# Patient Record
Sex: Male | Born: 1955 | Race: White | Hispanic: No | Marital: Single | Smoking: Current every day smoker
Health system: Southern US, Community
[De-identification: ages and names within clinical notes are randomized; demographics above are authoritative.]

## PROBLEM LIST (undated history)

## (undated) DIAGNOSIS — I82409 Acute embolism and thrombosis of unspecified deep veins of unspecified lower extremity: Secondary | ICD-10-CM

## (undated) DIAGNOSIS — J449 Chronic obstructive pulmonary disease, unspecified: Secondary | ICD-10-CM

## (undated) DIAGNOSIS — I2510 Atherosclerotic heart disease of native coronary artery without angina pectoris: Secondary | ICD-10-CM

## (undated) DIAGNOSIS — I10 Essential (primary) hypertension: Secondary | ICD-10-CM

## (undated) HISTORY — PX: IVC FILTER INSERTION: CATH118245

---

## 2004-03-08 ENCOUNTER — Other Ambulatory Visit: Payer: Self-pay

## 2004-03-08 ENCOUNTER — Emergency Department: Payer: Self-pay | Admitting: Unknown Physician Specialty

## 2004-05-17 ENCOUNTER — Inpatient Hospital Stay: Payer: Self-pay

## 2004-05-27 ENCOUNTER — Other Ambulatory Visit: Payer: Self-pay

## 2004-05-27 ENCOUNTER — Emergency Department: Payer: Self-pay | Admitting: Emergency Medicine

## 2004-06-27 ENCOUNTER — Ambulatory Visit: Payer: Self-pay

## 2005-10-13 ENCOUNTER — Inpatient Hospital Stay: Payer: Self-pay | Admitting: Internal Medicine

## 2006-08-21 ENCOUNTER — Other Ambulatory Visit: Payer: Self-pay

## 2006-08-22 ENCOUNTER — Inpatient Hospital Stay: Payer: Self-pay | Admitting: Internal Medicine

## 2008-03-27 ENCOUNTER — Emergency Department: Payer: Self-pay | Admitting: Emergency Medicine

## 2008-05-06 ENCOUNTER — Emergency Department: Payer: Self-pay | Admitting: Emergency Medicine

## 2008-05-08 ENCOUNTER — Emergency Department: Payer: Self-pay | Admitting: Emergency Medicine

## 2008-05-16 ENCOUNTER — Emergency Department: Payer: Self-pay | Admitting: Emergency Medicine

## 2008-08-03 ENCOUNTER — Emergency Department: Payer: Self-pay | Admitting: Internal Medicine

## 2010-01-10 IMAGING — CT CT ABD-PELV W/ CM
1 of 4 series · 13 of 32 positions shown, 19 images · non-contrast
Comparison: none

REASON FOR EXAM: (1) abd pain; (2) pel pain
COMMENTS:

[Series 2: abdomen · axial · 0.67mm/px · z∈[-990,-590]mm · 13 of 94 slices shown, 19 images]
[im 7/94  soft-tissue]
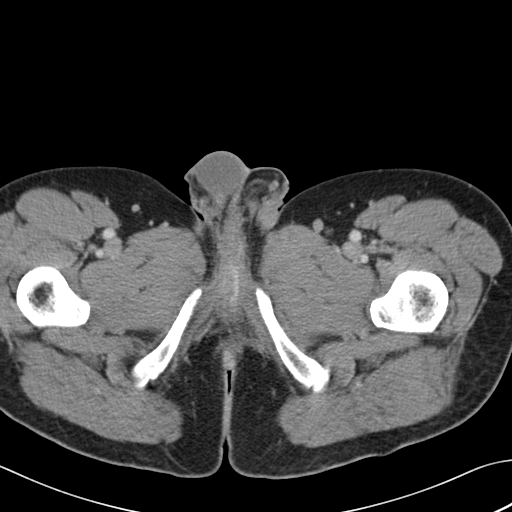
[im 7/94  bone]
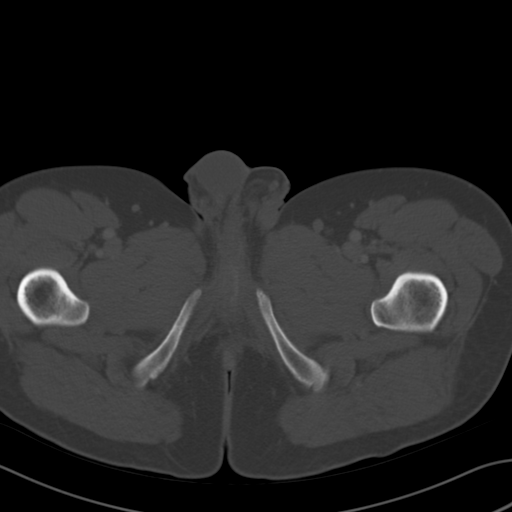
[im 14/94  soft-tissue]
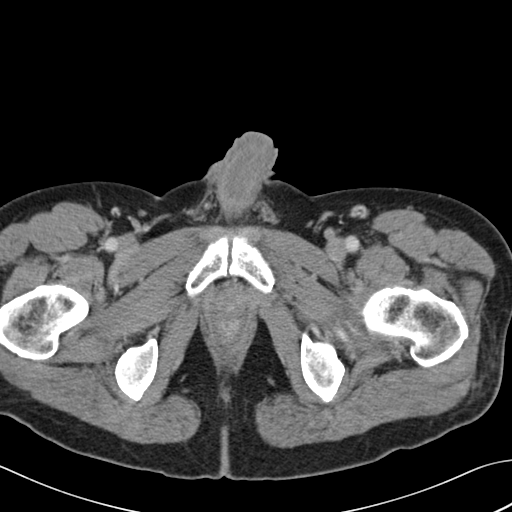
[im 20/94  soft-tissue]
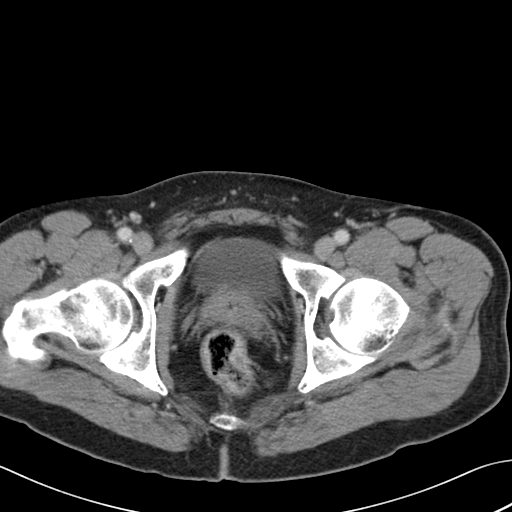
[im 27/94  soft-tissue]
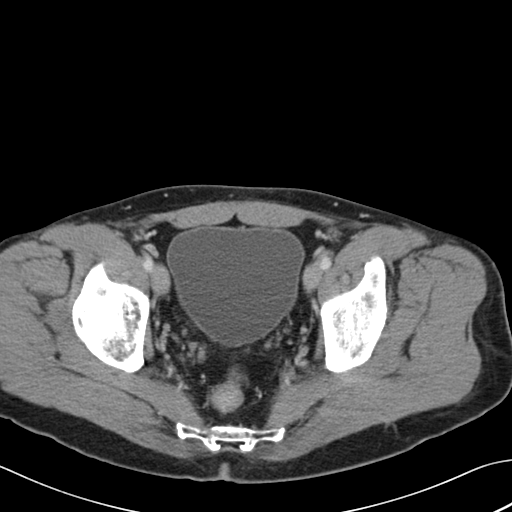
[im 34/94  soft-tissue]
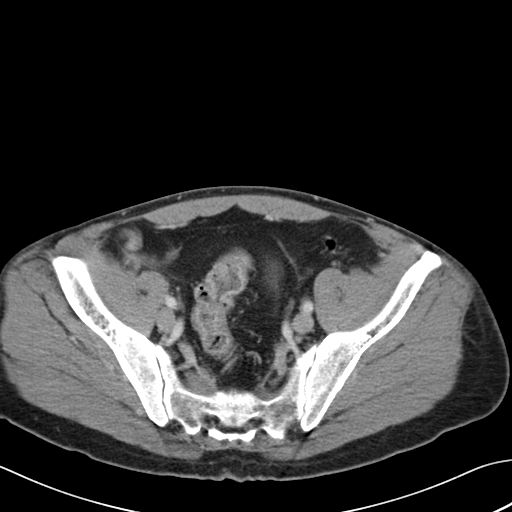
[im 40/94  soft-tissue]
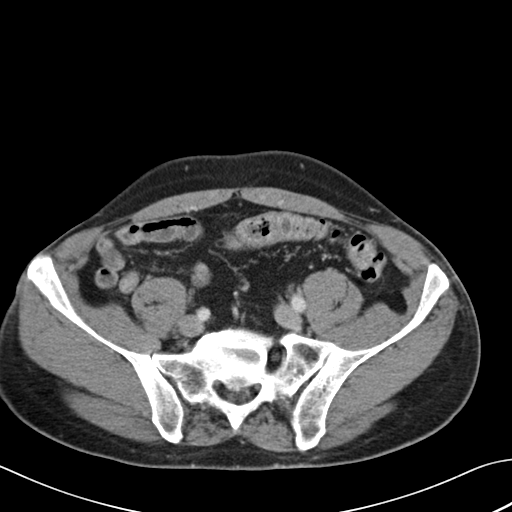
[im 47/94  soft-tissue]
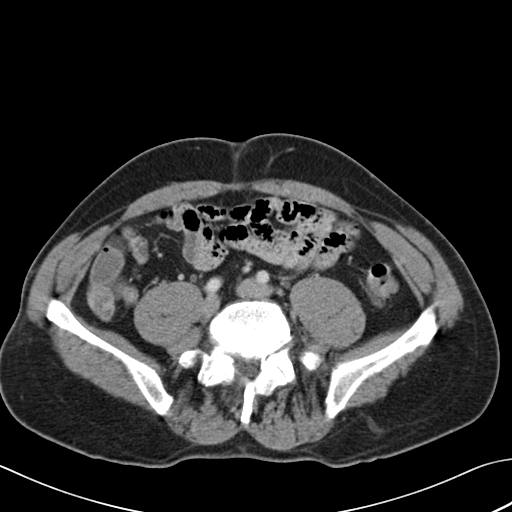
[im 54/94  soft-tissue]
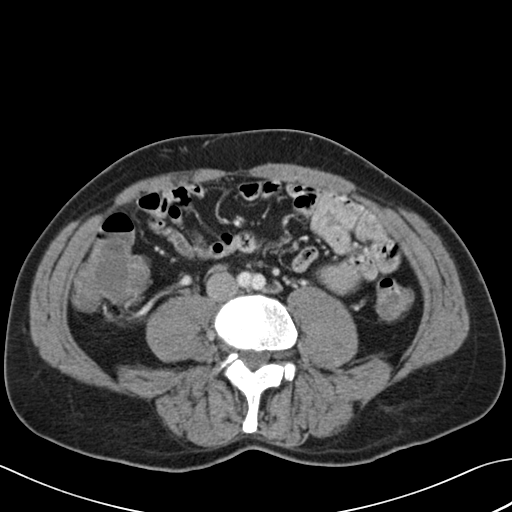
[im 60/94  soft-tissue]
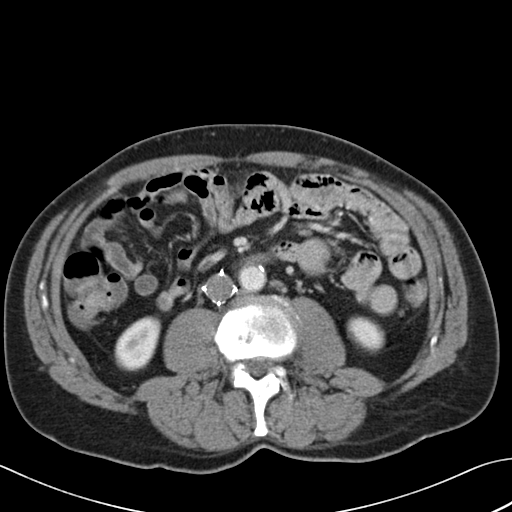
[im 60/94  bone]
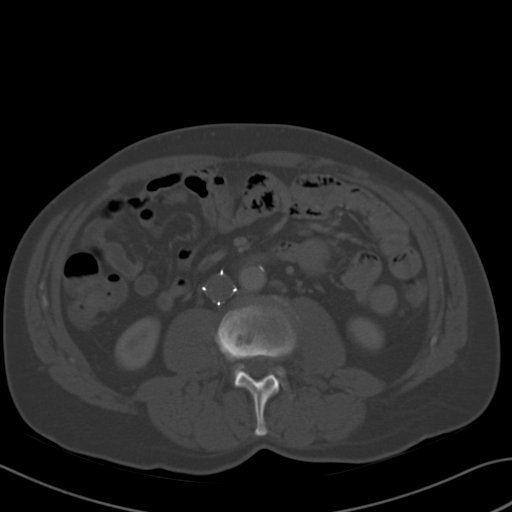
[im 67/94  soft-tissue]
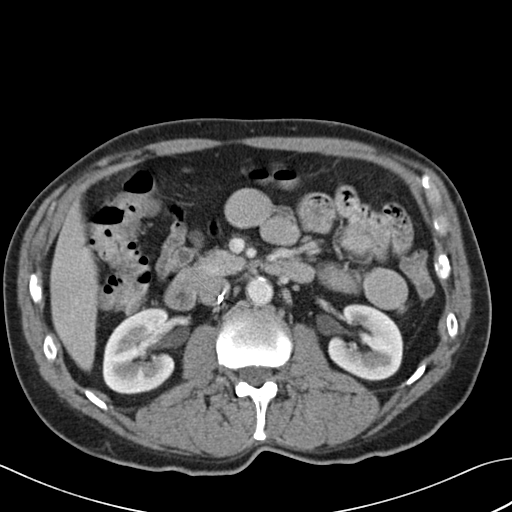
[im 67/94  lung]
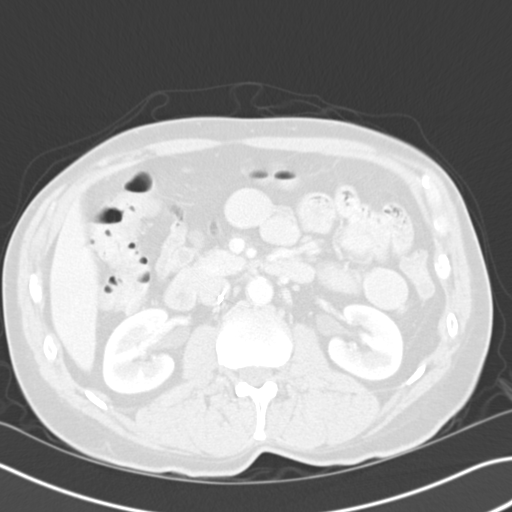
[im 74/94  soft-tissue]
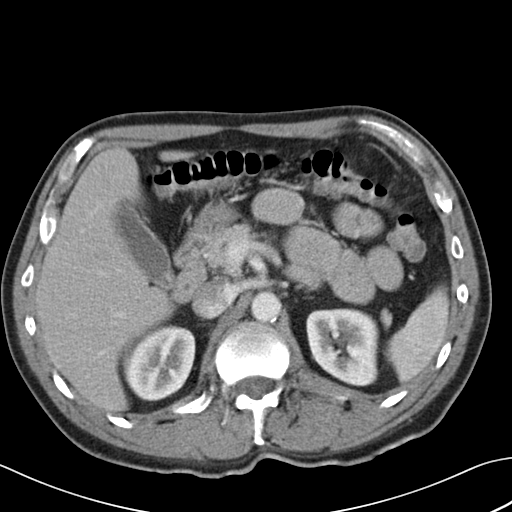
[im 74/94  lung]
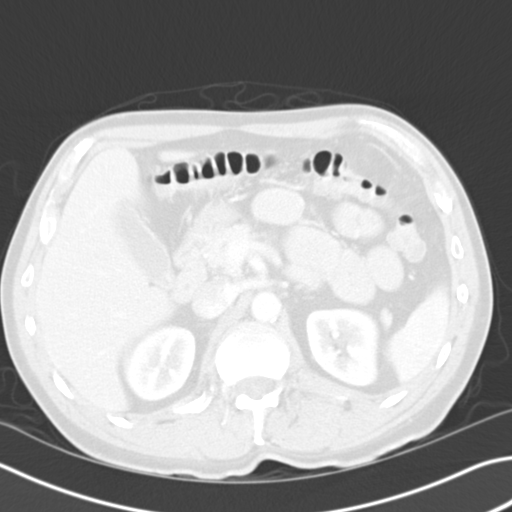
[im 80/94  soft-tissue]
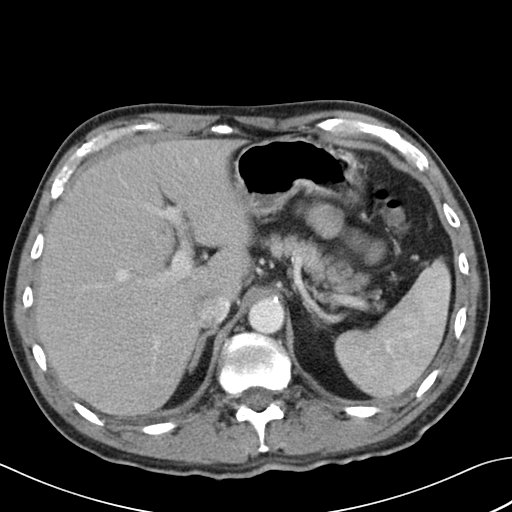
[im 80/94  lung]
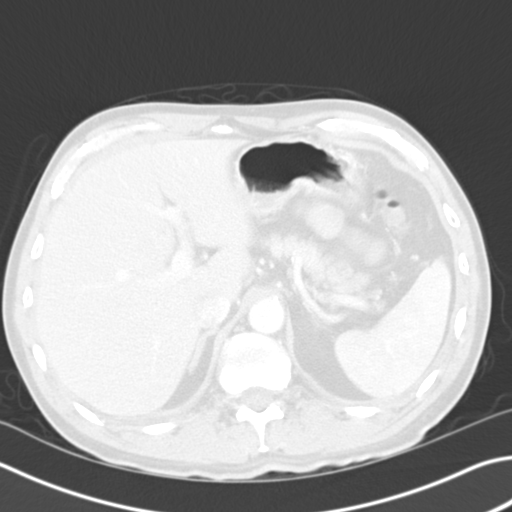
[im 87/94  soft-tissue]
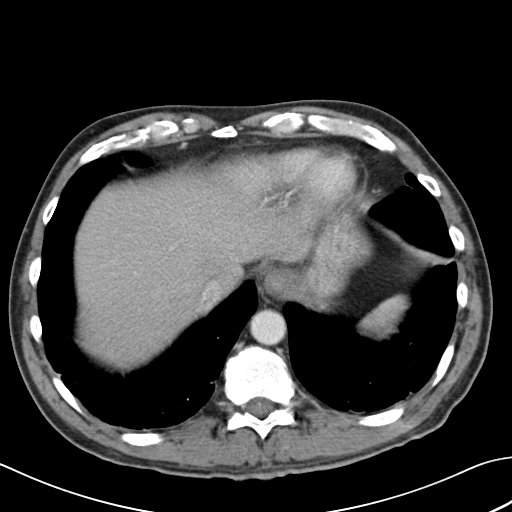
[im 87/94  lung]
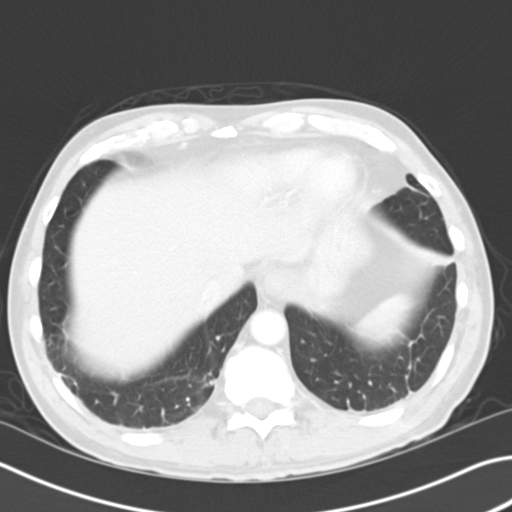

[13 of 32 positions shown; findings below may reference images not displayed]

PROCEDURE:     CT  - CT ABDOMEN / PELVIS  W  - August 03, 2008  [DATE]

RESULT:     History: Pain.

Comparison studies: No recent.

Procedure and Findings: Following demonstration 100 cc of [id number] CT of
the abdomen pelvis obtained. Simple cysts in the liver. These are present on
prior chest CT of 08/21/2006. Spleen normal. Pancreas normal. Adrenals normal.
Kidneys normal. Inferior vena caval filter noted. Caval filter struts are
projected outside of the cava. The filter is otherwise in good anatomic
position. Appendix normal. Pelvis unremarkable. No inguinal adenopathy
noted. Lung bases clear. No free air. Aorta is atherosclerotic.
IMPRESSION: 1.Inferior vena caval filter noted with struts projected
through the caval wall.
2. Simple hepatic cysts. Report phoned to the emergency room physician at
time of study.
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
## 2016-11-05 ENCOUNTER — Emergency Department: Payer: Medicaid - Out of State

## 2016-11-05 ENCOUNTER — Emergency Department
Admission: EM | Admit: 2016-11-05 | Discharge: 2016-11-05 | Disposition: A | Discharge status: Home / Self Care | Payer: Medicaid - Out of State | Attending: Emergency Medicine | Admitting: Emergency Medicine

## 2016-11-05 ENCOUNTER — Encounter: Payer: Self-pay | Admitting: Emergency Medicine

## 2016-11-05 DIAGNOSIS — J449 Chronic obstructive pulmonary disease, unspecified: Secondary | ICD-10-CM | POA: Diagnosis not present

## 2016-11-05 DIAGNOSIS — R1031 Right lower quadrant pain: Secondary | ICD-10-CM | POA: Diagnosis present

## 2016-11-05 DIAGNOSIS — I2510 Atherosclerotic heart disease of native coronary artery without angina pectoris: Secondary | ICD-10-CM | POA: Diagnosis not present

## 2016-11-05 DIAGNOSIS — I10 Essential (primary) hypertension: Secondary | ICD-10-CM | POA: Insufficient documentation

## 2016-11-05 DIAGNOSIS — F17200 Nicotine dependence, unspecified, uncomplicated: Secondary | ICD-10-CM | POA: Insufficient documentation

## 2016-11-05 DIAGNOSIS — R1909 Other intra-abdominal and pelvic swelling, mass and lump: Secondary | ICD-10-CM

## 2016-11-05 DIAGNOSIS — N50811 Right testicular pain: Secondary | ICD-10-CM

## 2016-11-05 HISTORY — DX: Chronic obstructive pulmonary disease, unspecified: J44.9

## 2016-11-05 HISTORY — DX: Essential (primary) hypertension: I10

## 2016-11-05 HISTORY — DX: Atherosclerotic heart disease of native coronary artery without angina pectoris: I25.10

## 2016-11-05 LAB — URINALYSIS, COMPLETE (UACMP) WITH MICROSCOPIC
BACTERIA UA: NONE SEEN
BILIRUBIN URINE: NEGATIVE
GLUCOSE, UA: NEGATIVE mg/dL
HGB URINE DIPSTICK: NEGATIVE
KETONES UR: NEGATIVE mg/dL
Leukocytes, UA: NEGATIVE
Nitrite: NEGATIVE
PROTEIN: NEGATIVE mg/dL
RBC / HPF: NONE SEEN RBC/hpf (ref 0–5)
Specific Gravity, Urine: 1.003 — ABNORMAL LOW (ref 1.005–1.030)
WBC UA: NONE SEEN WBC/hpf (ref 0–5)
pH: 5 (ref 5.0–8.0)

## 2016-11-05 MED ORDER — HYDROCODONE-ACETAMINOPHEN 10-325 MG PO TABS: 1.0000 | ORAL_TABLET | Freq: Four times a day (QID) | ORAL | 0 refills | Status: DC | PRN

## 2016-11-05 MED ORDER — HYDROCODONE-ACETAMINOPHEN 5-325 MG PO TABS
2.0000 | ORAL_TABLET | Freq: Once | ORAL | Status: AC
  Administered 2016-11-05: 2 via ORAL
  Filled 2016-11-05: qty 2

## 2016-11-05 NOTE — Discharge Instructions (Addendum)
Return to the ER for new or worsening pain, swelling, difficulty with bowel movements or passing gas, vomiting, fever, or any other new or worsening symptoms that concern you.

## 2016-11-05 NOTE — ED Provider Notes (Signed)
-----------------------------------------   6:33 PM on 11/05/2016 -----------------------------------------  The UA is negative, and the scrotal ultrasound shows no acute findings. Based on original discussion with Dr. Scotty CourtStafford, the plan was that if these tests are negative to discharge home with additional pain medication which has been prescribed. Patient feels well and is comfortable with the plan. Return precautions given. We will refer to primary care here as patient just moved back to the region and has no PMD.    Dionne BucySiadecki, Elson Ulbrich, MD 11/05/16 [phone]

## 2016-11-05 NOTE — ED Triage Notes (Signed)
Pt via pov from home. He has right inguinal hernia. Last Tuesday he fell and has had increasing groin/testicle pain since then. Pt hx of htn, copd. States he has not had his bp meds in a week because he had it in his luggage on a flight, and luggage was lost. Pt alert & oriented with NAD noted.

## 2016-11-05 NOTE — ED Notes (Signed)
Report received from Shannon RN.

## 2016-11-05 NOTE — ED Provider Notes (Addendum)
[facility] Emergency Department Provider Note  ____________________________________________  Time seen: Approximately 3:17 PM  I have reviewed the triage vital signs and the nursing notes.   Bradley  Chief Complaint Inguinal Hernia    HPI PRESS Dawson is a 61 y.o. male who complains of chronic mild right inguinal pain related to a small inguinal hernia, but he fell 4 days ago and since then has had worsened right inguinal pain radiating into the testicle. Denies any dysuria frequency urgency or hematuria. No significant trauma. No abdominal pain or vomiting. Last bowel movement was today. He is passing gas as usual. Denies fever chills or sweats. No aggravating or alleviating factors, severe, dull aching.     Past Medical Bradley:  Diagnosis Date  . COPD (chronic obstructive pulmonary disease) (HCC)   . Coronary artery disease   . Hypertension      There are no active problems to display for this patient.    Bradley reviewed. No pertinent surgical Bradley.   Prior to Admission medications   Not on File     Allergies Patient has no known allergies.   Bradley reviewed. No pertinent family Bradley.  Social Bradley Social Bradley  Substance Use Topics  . Smoking status: Current Every Day Smoker    Packs/day: 0.50  . Smokeless tobacco: Never Used  . Alcohol use No    Review of Systems  Constitutional:   No fever or chills.  ENT:   No sore throat. No rhinorrhea. Cardiovascular:   No chest pain or syncope. Respiratory:   No dyspnea or cough. Gastrointestinal:   Negative for abdominal pain, vomiting and diarrhea. positive right groin pain Musculoskeletal:   Negative for focal pain or swelling All other systems reviewed and are negative except as documented above in ROS and HPI.  ____________________________________________   PHYSICAL EXAM:  VITAL SIGNS: ED Triage Vitals  Enc Vitals Group     BP 11/05/16 1321 (!) 183/114   Pulse Rate 11/05/16 1321 (!) 103     Resp 11/05/16 1321 18     Temp 11/05/16 1321 97.7 F (36.5 C)     Temp Source 11/05/16 1321 Oral     SpO2 11/05/16 1321 99 %     Weight 11/05/16 1322 140 lb (63.5 kg)     Height 11/05/16 1322 6' (1.829 m)     Head Circumference --      Peak Flow --      Pain Score 11/05/16 1320 9     Pain Loc --      Pain Edu? --      Excl. in GC? --     Vital signs reviewed, nursing assessments reviewed.   Constitutional:   Alert and oriented. Well appearing and in no distress. Eyes:   No scleral icterus.  EOMI. No nystagmus. No conjunctival pallor. PERRL. ENT   Head:   Normocephalic and atraumatic.   Nose:   No congestion/rhinnorhea.    Mouth/Throat:   MMM, no pharyngeal erythema. No peritonsillar mass.    Neck:   No meningismus. Full ROM. Hematological/Lymphatic/Immunilogical:   No cervical lymphadenopathy. Cardiovascular:   RRR. Symmetric bilateral radial and DP pulses.  No murmurs.  Respiratory:   Normal respiratory effort without tachypnea/retractions. Breath sounds are clear and equal bilaterally. No wheezes/rales/rhonchi. Gastrointestinal:   Soft and nontender. Non distended. There is no CVA tenderness.  No rebound, rigidity, or guarding. Genitourinary:   no scrotal swelling. No penile discharge. Right testicular tenderness. No horizontal lie. No mass  but there is tenderness along the right inguinal canal and fullness on digital exploration of the external ring. Musculoskeletal:   Normal range of motion in all extremities. No joint effusions.  No lower extremity tenderness.  No edema. Neurologic:   Normal speech and language.  Motor grossly intact. No gross focal neurologic deficits are appreciated.  Skin:    Skin is warm, dry and intact. No rash noted.  No petechiae, purpura, or bullae.  ____________________________________________    LABS (pertinent positives/negatives) (all labs ordered are listed, but only abnormal results are  displayed) Labs Reviewed  URINE CULTURE  URINALYSIS, COMPLETE (UACMP) WITH MICROSCOPIC   ____________________________________________   EKG    ____________________________________________    RADIOLOGY  No results found.  ____________________________________________   PROCEDURES Procedures  ____________________________________________   DIFFERENTIAL DIAGNOSIS  epididymitis, orchitis, indirect inguinal hernia, urinary tract infection  CLINICAL IMPRESSION / ASSESSMENT AND PLAN / ED COURSE  Pertinent labs & imaging results that were available during my care of the patient were reviewed by me and considered in my medical decision making (see chart for details).   patient presents with right groin and testicle pain. Low suspicion of kidney stone torsion bowel incarceration or strangulation. No evidence of bowel obstruction appendicitis or perforation. We'll obtain ultrasound of the scrotum to evaluate for testicular pathology versus inguinal hernia. Check urinalysis. Care of patient be signed out to Dr. Marisa SeverinSiadecki.  patient does not have any prior prescriptions located in the controlled substance reporting system, so I have written a very limited prescription of his chronic opioid pain medication until he can recover his lost luggage.  Clinical Course as of Nov 05 1532  Wed Nov 05, 2016  1530 I received signout from Dr. Scotty CourtStafford.  Likely acute on chronic hernia pain.  If negative scrotal US and UA, anticipate d/c home.   [SS]    Clinical Course User Index [SS] Dionne BucySiadecki, Sebastian, MD     ____________________________________________   FINAL CLINICAL IMPRESSION(S) / ED DIAGNOSES    Final diagnoses:  Right groin pain      New Prescriptions   No medications on file     Portions of this note were generated with dragon dictation software. Dictation errors may occur despite best attempts at proofreading.    Sharman CheekStafford, Gwenyth Dingee, MD 11/05/16 1525    Sharman CheekStafford,  Meghana Tullo, MD 11/05/16 [phone]

## 2016-11-08 LAB — URINE CULTURE: CULTURE: NO GROWTH

## 2017-04-20 ENCOUNTER — Encounter: Payer: Self-pay | Admitting: Podiatry

## 2017-04-20 ENCOUNTER — Ambulatory Visit: Payer: Self-pay

## 2017-04-20 DIAGNOSIS — M779 Enthesopathy, unspecified: Principal | ICD-10-CM

## 2017-04-20 DIAGNOSIS — M778 Other enthesopathies, not elsewhere classified: Secondary | ICD-10-CM

## 2017-04-20 NOTE — Progress Notes (Signed)
This encounter was created in error - please disregard.

## 2017-05-14 ENCOUNTER — Inpatient Hospital Stay
Admission: EM | Admit: 2017-05-14 | Discharge: 2017-05-16 | DRG: 272 | Disposition: A | Discharge status: Home / Self Care | Payer: Medicaid Other | Attending: Internal Medicine | Admitting: Internal Medicine

## 2017-05-14 ENCOUNTER — Encounter: Payer: Self-pay | Admitting: Emergency Medicine

## 2017-05-14 ENCOUNTER — Other Ambulatory Visit: Payer: Self-pay

## 2017-05-14 ENCOUNTER — Emergency Department: Payer: Medicaid Other

## 2017-05-14 DIAGNOSIS — Z79899 Other long term (current) drug therapy: Secondary | ICD-10-CM | POA: Diagnosis not present

## 2017-05-14 DIAGNOSIS — E876 Hypokalemia: Secondary | ICD-10-CM | POA: Diagnosis present

## 2017-05-14 DIAGNOSIS — I82220 Acute embolism and thrombosis of inferior vena cava: Secondary | ICD-10-CM | POA: Diagnosis present

## 2017-05-14 DIAGNOSIS — M7989 Other specified soft tissue disorders: Secondary | ICD-10-CM | POA: Diagnosis present

## 2017-05-14 DIAGNOSIS — Z7901 Long term (current) use of anticoagulants: Secondary | ICD-10-CM

## 2017-05-14 DIAGNOSIS — Z7982 Long term (current) use of aspirin: Secondary | ICD-10-CM | POA: Diagnosis not present

## 2017-05-14 DIAGNOSIS — Z7951 Long term (current) use of inhaled steroids: Secondary | ICD-10-CM

## 2017-05-14 DIAGNOSIS — I87002 Postthrombotic syndrome without complications of left lower extremity: Secondary | ICD-10-CM | POA: Diagnosis present

## 2017-05-14 DIAGNOSIS — Z86718 Personal history of other venous thrombosis and embolism: Secondary | ICD-10-CM

## 2017-05-14 DIAGNOSIS — F17210 Nicotine dependence, cigarettes, uncomplicated: Secondary | ICD-10-CM | POA: Diagnosis present

## 2017-05-14 DIAGNOSIS — I82411 Acute embolism and thrombosis of right femoral vein: Secondary | ICD-10-CM | POA: Diagnosis present

## 2017-05-14 DIAGNOSIS — I2510 Atherosclerotic heart disease of native coronary artery without angina pectoris: Secondary | ICD-10-CM | POA: Diagnosis present

## 2017-05-14 DIAGNOSIS — I10 Essential (primary) hypertension: Secondary | ICD-10-CM | POA: Diagnosis present

## 2017-05-14 DIAGNOSIS — J449 Chronic obstructive pulmonary disease, unspecified: Secondary | ICD-10-CM | POA: Diagnosis present

## 2017-05-14 DIAGNOSIS — R600 Localized edema: Secondary | ICD-10-CM | POA: Diagnosis not present

## 2017-05-14 DIAGNOSIS — I82421 Acute embolism and thrombosis of right iliac vein: Secondary | ICD-10-CM | POA: Diagnosis present

## 2017-05-14 DIAGNOSIS — I82401 Acute embolism and thrombosis of unspecified deep veins of right lower extremity: Secondary | ICD-10-CM | POA: Diagnosis not present

## 2017-05-14 DIAGNOSIS — M79604 Pain in right leg: Secondary | ICD-10-CM | POA: Diagnosis not present

## 2017-05-14 DIAGNOSIS — O2230 Deep phlebothrombosis in pregnancy, unspecified trimester: Secondary | ICD-10-CM | POA: Diagnosis present

## 2017-05-14 DIAGNOSIS — I82402 Acute embolism and thrombosis of unspecified deep veins of left lower extremity: Secondary | ICD-10-CM | POA: Diagnosis not present

## 2017-05-14 DIAGNOSIS — I824Y1 Acute embolism and thrombosis of unspecified deep veins of right proximal lower extremity: Secondary | ICD-10-CM

## 2017-05-14 HISTORY — DX: Acute embolism and thrombosis of unspecified deep veins of unspecified lower extremity: I82.409

## 2017-05-14 LAB — BASIC METABOLIC PANEL
ANION GAP: 10 (ref 5–15)
BUN: 6 mg/dL (ref 6–20)
CALCIUM: 8.8 mg/dL — AB (ref 8.9–10.3)
CO2: 26 mmol/L (ref 22–32)
Chloride: 102 mmol/L (ref 101–111)
Creatinine, Ser: 0.75 mg/dL (ref 0.61–1.24)
GLUCOSE: 94 mg/dL (ref 65–99)
Potassium: 4.1 mmol/L (ref 3.5–5.1)
SODIUM: 138 mmol/L (ref 135–145)

## 2017-05-14 LAB — PROTIME-INR
INR: 1.01
Prothrombin Time: 13.2 seconds (ref 11.4–15.2)

## 2017-05-14 LAB — CBC WITH DIFFERENTIAL/PLATELET
BASOS ABS: 0.1 10*3/uL (ref 0–0.1)
BASOS PCT: 1 %
EOS ABS: 0.2 10*3/uL (ref 0–0.7)
EOS PCT: 4 %
HCT: 47.5 % (ref 40.0–52.0)
Hemoglobin: 15.6 g/dL (ref 13.0–18.0)
Lymphocytes Relative: 24 %
Lymphs Abs: 1.6 10*3/uL (ref 1.0–3.6)
MCH: 29.3 pg (ref 26.0–34.0)
MCHC: 32.9 g/dL (ref 32.0–36.0)
MCV: 89.2 fL (ref 80.0–100.0)
MONO ABS: 0.8 10*3/uL (ref 0.2–1.0)
Monocytes Relative: 12 %
Neutro Abs: 3.9 10*3/uL (ref 1.4–6.5)
Neutrophils Relative %: 59 %
PLATELETS: 301 10*3/uL (ref 150–440)
RBC: 5.32 MIL/uL (ref 4.40–5.90)
RDW: 15.5 % — AB (ref 11.5–14.5)
WBC: 6.6 10*3/uL (ref 3.8–10.6)

## 2017-05-14 LAB — APTT: APTT: 30 s (ref 24–36)

## 2017-05-14 MED ORDER — AMLODIPINE BESYLATE 5 MG PO TABS
5.0000 mg | ORAL_TABLET | Freq: Every day | ORAL | Status: DC
  Administered 2017-05-14 – 2017-05-16 (×3): 5 mg via ORAL
  Filled 2017-05-14 (×3): qty 1

## 2017-05-14 MED ORDER — MORPHINE SULFATE (PF) 4 MG/ML IV SOLN
4.0000 mg | Freq: Once | INTRAVENOUS | Status: AC
  Administered 2017-05-14: 4 mg via INTRAVENOUS
  Filled 2017-05-14: qty 1

## 2017-05-14 MED ORDER — HEPARIN (PORCINE) IN NACL 100-0.45 UNIT/ML-% IJ SOLN
1450.0000 [IU]/h | INTRAMUSCULAR | Status: DC
Start: 2017-05-14 — End: 2017-05-16
  Administered 2017-05-14: 1000 [IU]/h via INTRAVENOUS
  Filled 2017-05-14 (×2): qty 250

## 2017-05-14 MED ORDER — HYDROCODONE-ACETAMINOPHEN 10-325 MG PO TABS
1.0000 | ORAL_TABLET | Freq: Four times a day (QID) | ORAL | Status: DC | PRN
  Administered 2017-05-14 – 2017-05-16 (×4): 1 via ORAL
  Filled 2017-05-14 (×4): qty 1

## 2017-05-14 MED ORDER — NICOTINE 21 MG/24HR TD PT24
21.0000 mg | MEDICATED_PATCH | Freq: Every day | TRANSDERMAL | Status: DC
  Administered 2017-05-14 – 2017-05-16 (×3): 21 mg via TRANSDERMAL
  Filled 2017-05-14 (×3): qty 1

## 2017-05-14 MED ORDER — AMLODIPINE BESYLATE 5 MG PO TABS: 5.0000 mg | ORAL_TABLET | Freq: Every day | ORAL | Status: DC

## 2017-05-14 MED ORDER — ATORVASTATIN CALCIUM 20 MG PO TABS
40.0000 mg | ORAL_TABLET | Freq: Every day | ORAL | Status: DC
  Administered 2017-05-15: 40 mg via ORAL
  Filled 2017-05-14: qty 2

## 2017-05-14 MED ORDER — LEVOCETIRIZINE DIHYDROCHLORIDE 5 MG PO TABS: 5.0000 mg | ORAL_TABLET | Freq: Every evening | ORAL | Status: DC

## 2017-05-14 MED ORDER — DOCUSATE SODIUM 100 MG PO CAPS
100.0000 mg | ORAL_CAPSULE | Freq: Two times a day (BID) | ORAL | Status: DC
  Administered 2017-05-14 – 2017-05-16 (×3): 100 mg via ORAL
  Filled 2017-05-14 (×3): qty 1

## 2017-05-14 MED ORDER — LORATADINE 10 MG PO TABS
10.0000 mg | ORAL_TABLET | Freq: Every evening | ORAL | Status: DC
  Administered 2017-05-14 – 2017-05-15 (×2): 10 mg via ORAL
  Filled 2017-05-14 (×2): qty 1

## 2017-05-14 MED ORDER — IPRATROPIUM-ALBUTEROL 0.5-2.5 (3) MG/3ML IN SOLN: 3.0000 mL | Freq: Four times a day (QID) | RESPIRATORY_TRACT | Status: DC | PRN

## 2017-05-14 MED ORDER — SODIUM CHLORIDE 0.9 % IV SOLN: 250.0000 mL | INTRAVENOUS | Status: DC | PRN

## 2017-05-14 MED ORDER — SODIUM CHLORIDE 0.9% FLUSH: 3.0000 mL | INTRAVENOUS | Status: DC | PRN

## 2017-05-14 MED ORDER — SERTRALINE HCL 50 MG PO TABS
50.0000 mg | ORAL_TABLET | Freq: Every day | ORAL | Status: DC
  Administered 2017-05-15 – 2017-05-16 (×2): 50 mg via ORAL
  Filled 2017-05-14 (×2): qty 1

## 2017-05-14 MED ORDER — ISOSORBIDE MONONITRATE ER 30 MG PO TB24
60.0000 mg | ORAL_TABLET | Freq: Every day | ORAL | Status: DC
  Administered 2017-05-15 – 2017-05-16 (×2): 60 mg via ORAL
  Filled 2017-05-14 (×2): qty 2

## 2017-05-14 MED ORDER — ACETAMINOPHEN 650 MG RE SUPP: 650.0000 mg | Freq: Four times a day (QID) | RECTAL | Status: DC | PRN

## 2017-05-14 MED ORDER — UMECLIDINIUM-VILANTEROL 62.5-25 MCG/INH IN AEPB
1.0000 | INHALATION_SPRAY | Freq: Every day | RESPIRATORY_TRACT | Status: DC
  Administered 2017-05-16: 1 via RESPIRATORY_TRACT
  Filled 2017-05-14: qty 14

## 2017-05-14 MED ORDER — ONDANSETRON HCL 4 MG/2ML IJ SOLN: 4.0000 mg | Freq: Four times a day (QID) | INTRAMUSCULAR | Status: DC | PRN

## 2017-05-14 MED ORDER — GABAPENTIN 300 MG PO CAPS
300.0000 mg | ORAL_CAPSULE | Freq: Three times a day (TID) | ORAL | Status: DC
  Administered 2017-05-14 – 2017-05-16 (×4): 300 mg via ORAL
  Filled 2017-05-14 (×4): qty 1

## 2017-05-14 MED ORDER — ACETAMINOPHEN 325 MG PO TABS: 650.0000 mg | ORAL_TABLET | Freq: Four times a day (QID) | ORAL | Status: DC | PRN

## 2017-05-14 MED ORDER — RANOLAZINE ER 500 MG PO TB12
500.0000 mg | ORAL_TABLET | Freq: Two times a day (BID) | ORAL | Status: DC
  Administered 2017-05-14 – 2017-05-16 (×3): 500 mg via ORAL
  Filled 2017-05-14 (×5): qty 1

## 2017-05-14 MED ORDER — ASPIRIN EC 81 MG PO TBEC
81.0000 mg | DELAYED_RELEASE_TABLET | Freq: Every day | ORAL | Status: DC
  Administered 2017-05-15 – 2017-05-16 (×2): 81 mg via ORAL
  Filled 2017-05-14 (×2): qty 1

## 2017-05-14 MED ORDER — ONDANSETRON HCL 4 MG PO TABS: 4.0000 mg | ORAL_TABLET | Freq: Four times a day (QID) | ORAL | Status: DC | PRN

## 2017-05-14 MED ORDER — SODIUM CHLORIDE 0.9% FLUSH
3.0000 mL | Freq: Two times a day (BID) | INTRAVENOUS | Status: DC
  Administered 2017-05-15 – 2017-05-16 (×2): 3 mL via INTRAVENOUS

## 2017-05-14 MED ORDER — TRAZODONE HCL 50 MG PO TABS
50.0000 mg | ORAL_TABLET | Freq: Every evening | ORAL | Status: DC | PRN
  Administered 2017-05-14: 50 mg via ORAL
  Filled 2017-05-14: qty 1

## 2017-05-14 MED ORDER — ATENOLOL 25 MG PO TABS
25.0000 mg | ORAL_TABLET | Freq: Every day | ORAL | Status: DC
  Administered 2017-05-16: 25 mg via ORAL
  Filled 2017-05-14 (×2): qty 1

## 2017-05-14 MED ORDER — HEPARIN BOLUS VIA INFUSION
3600.0000 [IU] | Freq: Once | INTRAVENOUS | Status: AC
  Administered 2017-05-14: 3600 [IU] via INTRAVENOUS
  Filled 2017-05-14: qty 3600

## 2017-05-14 MED ORDER — ALBUTEROL SULFATE (2.5 MG/3ML) 0.083% IN NEBU: 2.5000 mg | INHALATION_SOLUTION | Freq: Four times a day (QID) | RESPIRATORY_TRACT | Status: DC | PRN

## 2017-05-14 NOTE — Progress Notes (Signed)
Advanced care plan.  Purpose of the Encounter: CODE STATUS Parties in Attendance: Patient Patient's Decision Capacity: Good Subjective/Patient's story: Presented to the ER with right leg pain and swelling Objective/Medical story Has DVT in the right lower extremity Goals of care determination:  Advanced directives discussed Currently wants everything done which includes cardiac resuscitation, intubation and ventilator if the need arises CODE STATUS: Full code Time spent discussing advanced care planning: 16 minutes

## 2017-05-14 NOTE — ED Triage Notes (Addendum)
Pt comes into the ED via POV c/o right leg swelling that started about 3 days ago.  Patient has swelling noted to the leg and the patient states that at some points it looks blue in color.  Patient is ambulatory at this time.  Patient has his of CAD.  Patient in NAD at this time.  Patient has a h/o blood clots in the past. No pedal pulses palpated at this time and cyanosis present on toes.  Doppler used on patient to find pulses, no pedal pulse found with doppler, only posterior tibial pulse noted at this time.

## 2017-05-14 NOTE — H&P (Signed)
[facility] - [place] at [facility]   PATIENT NAME: Bradley Dawson    MR#:  [id number]  DATE OF BIRTH:  04/28/1955  DATE OF ADMISSION:  05/14/2017  PRIMARY CARE PHYSICIAN: Patient, No Pcp Per   REQUESTING/REFERRING PHYSICIAN:   CHIEF COMPLAINT:   Chief Complaint  Patient presents with  . Leg Swelling    HISTORY OF PRESENT ILLNESS: Bradley Dawson  is a 62 y.o. male with a known history of DVT in the past hypertension, COPD and coronary artery disease presented to the emergency room with swelling of the right and lower extremity.  Patient had lower extremity DVT on the left side in the past and was on Eliquis.  Patient has moved from [place] to [place] in October last year.  Patient has not been taking oral Eliquis for the last couple of months since last October he was evaluated with a Doppler ultrasound in the emergency room which showed extensive right lower extremity DVT.[name] He also has a existing DVT in the left lower extremity.  Patient was started on IV heparin drip for anticoagulation and hospitalist service was consulted.  Pain in the right lower extremity is aching in nature 7 out of 10 on a scale of 1-10.  PAST MEDICAL HISTORY:   Past Medical History:  Diagnosis Date  . COPD (chronic obstructive pulmonary disease) (HCC)   . Coronary artery disease   . DVT (deep venous thrombosis) (HCC)   . Hypertension     PAST SURGICAL HISTORY:  Past Surgical History:  Procedure Laterality Date  . IVC FILTER INSERTION      SOCIAL HISTORY:  Social History   Tobacco Use  . Smoking status: Current Every Day Smoker    Packs/day: 0.50  . Smokeless tobacco: Never Used  Substance Use Topics  . Alcohol use: No    FAMILY HISTORY:  Family History  Problem Relation Age of Onset  . Pulmonary embolism Mother   . Cancer Father     DRUG ALLERGIES: No Known Allergies  REVIEW OF SYSTEMS:   CONSTITUTIONAL: No fever, fatigue or weakness.  EYES: No blurred or  double vision.  EARS, NOSE, AND THROAT: No tinnitus or ear pain.  RESPIRATORY: No cough, shortness of breath, wheezing or hemoptysis.  CARDIOVASCULAR: No chest pain, orthopnea, edema.  GASTROINTESTINAL: No nausea, vomiting, diarrhea or abdominal pain.  GENITOURINARY: No dysuria, hematuria.  ENDOCRINE: No polyuria, nocturia,  HEMATOLOGY: No anemia, easy bruising or bleeding SKIN: No rash or lesion. MUSCULOSKELETAL: No joint pain or arthritis.   Swelling of lower extremities NEUROLOGIC: No tingling, numbness, weakness.  PSYCHIATRY: No anxiety or depression.   MEDICATIONS AT HOME:  Prior to Admission medications   Medication Sig Start Date End Date Taking? Authorizing Provider  albuterol (PROVENTIL HFA;VENTOLIN HFA) 108 (90 Base) MCG/ACT inhaler Inhale 2 puffs into the lungs every 6 (six) hours as needed for wheezing.    [provider]  apixaban (ELIQUIS) 5 MG TABS tablet Take 5 mg by mouth 2 (two) times daily.    [provider]  aspirin EC 81 MG tablet Take 81 mg by mouth daily.    [provider]  atenolol (TENORMIN) 25 MG tablet Take 25 mg by mouth daily.    [provider]  atorvastatin (LIPITOR) 40 MG tablet Take 40 mg by mouth daily with supper.    [provider]  gabapentin (NEURONTIN) 300 MG capsule Take 300 mg by mouth 3 (three) times daily.    [provider]  HYDROcodone-acetaminophen (NORCO) 10-325 MG tablet Take 1 tablet by mouth every 6 (six) hours as needed. 11/05/16   Sharman CheekStafford, Phillip, MD  Ipratropium-Albuterol (COMBIVENT IN) Inhale 2 puffs into the lungs every 6 (six) hours as needed (Wheezing).    [provider]  ipratropium-albuterol (DUONEB) 0.5-2.5 (3) MG/3ML SOLN Take 3 mLs by nebulization every 6 (six) hours as needed.    [provider]  isosorbide mononitrate (IMDUR) 60 MG 24 hr tablet Take 60 mg by mouth daily.    [provider]  levocetirizine (XYZAL) 5 MG tablet Take 5 mg by  mouth every evening.    [provider]  ranolazine (RANEXA) 1000 MG SR tablet Take 500 mg by mouth 2 (two) times daily.    [provider]  sertraline (ZOLOFT) 50 MG tablet Take 50 mg by mouth daily.    [provider]  traZODone (DESYREL) 50 MG tablet Take 50 mg by mouth at bedtime as needed for sleep.    [provider]  umeclidinium-vilanterol (ANORO ELLIPTA) 62.5-25 MCG/INH AEPB Inhale 1 puff into the lungs daily.    [provider]      PHYSICAL EXAMINATION:   VITAL SIGNS: Blood pressure (!) 164/96, pulse 83, temperature 99.4 F (37.4 C), temperature source Oral, resp. rate 16, height 6\' 1"  (1.854 m), weight 61.2 kg (135 lb), SpO2 100 %.  GENERAL:  62 y.o.-year-old patient lying in the bed with no acute distress.  EYES: Pupils equal, round, reactive to light and accommodation. No scleral icterus. Extraocular muscles intact.  HEENT: Head atraumatic, normocephalic. Oropharynx and nasopharynx clear.  NECK:  Supple, no jugular venous distention. No thyroid enlargement, no tenderness.  LUNGS: Normal breath sounds bilaterally, no wheezing, rales,rhonchi or crepitation. No use of accessory muscles of respiration.  CARDIOVASCULAR: S1, S2 normal. No murmurs, rubs, or gallops.  ABDOMEN: Soft, nontender, nondistended. Bowel sounds present. No organomegaly or mass.  EXTREMITIES: No cyanosis, or clubbing.  Swelling in right leg greater than left leg Tenderness in right lower extremity NEUROLOGIC: Cranial nerves II through XII are intact. Muscle strength 5/5 in all extremities. Sensation intact. Gait not checked.  PSYCHIATRIC: The patient is alert and oriented x 3.  SKIN: No obvious rash, lesion, or ulcer.   LABORATORY PANEL:   CBC Recent Labs  Lab 05/14/17 1944  WBC 6.6  HGB 15.6  HCT 47.5  PLT 301  MCV 89.2  MCH 29.3  MCHC 32.9  RDW 15.5*  LYMPHSABS 1.6  MONOABS 0.8  EOSABS 0.2  BASOSABS 0.1    ------------------------------------------------------------------------------------------------------------------  Chemistries  Recent Labs  Lab 05/14/17 1944  NA 138  K 4.1  CL 102  CO2 26  GLUCOSE 94  BUN 6  CREATININE 0.75  CALCIUM 8.8*   ------------------------------------------------------------------------------------------------------------------ estimated creatinine clearance is 83.9 mL/min (by C-G formula based on SCr of 0.75 mg/dL). ------------------------------------------------------------------------------------------------------------------ No results for input(s): TSH, T4TOTAL, T3FREE, THYROIDAB in the last 72 hours.  Invalid input(s): FREET3   Coagulation profile No results for input(s): INR, PROTIME in the last 168 hours. ------------------------------------------------------------------------------------------------------------------- No results for input(s): DDIMER in the last 72 hours. -------------------------------------------------------------------------------------------------------------------  Cardiac Enzymes No results for input(s): CKMB, TROPONINI, MYOGLOBIN in the last 168 hours.  Invalid input(s): CK ------------------------------------------------------------------------------------------------------------------ Invalid input(s): POCBNP  ---------------------------------------------------------------------------------------------------------------  Urinalysis    Component Value Date/Time   COLORURINE COLORLESS (A) 11/05/2016 1458   APPEARANCEUR CLEAR (A) 11/05/2016 1458   LABSPEC 1.003 (L) 11/05/2016 1458   PHURINE 5.0 11/05/2016 1458   GLUCOSEU NEGATIVE 11/05/2016 1458  HGBUR NEGATIVE 11/05/2016 1458   BILIRUBINUR NEGATIVE 11/05/2016 1458   KETONESUR NEGATIVE 11/05/2016 1458   PROTEINUR NEGATIVE 11/05/2016 1458   NITRITE NEGATIVE 11/05/2016 1458   LEUKOCYTESUR NEGATIVE 11/05/2016 1458     RADIOLOGY: US Venous Img Lower  Unilateral Right  Result Date: 05/14/2017 CLINICAL DATA:  62 year old male with right lower extremity swelling for the past 3 days. Prior history left lower extremity DVT EXAM: RIGHT LOWER EXTREMITY VENOUS DOPPLER ULTRASOUND TECHNIQUE: Gray-scale sonography with graded compression, as well as color Doppler and duplex ultrasound were performed to evaluate the lower extremity deep venous systems from the level of the common femoral vein and including the common femoral, femoral, profunda femoral, popliteal and calf veins including the posterior tibial, peroneal and gastrocnemius veins when visible. The superficial great saphenous vein was also interrogated. Spectral Doppler was utilized to evaluate flow at rest and with distal augmentation maneuvers in the common femoral, femoral and popliteal veins. COMPARISON:  None. FINDINGS: Contralateral Common Femoral Vein: Abnormal contralateral left common femoral vein. The vessel is partially compressible. There is internal echogenic material but fairly significant flow on color Doppler imaging. Common Femoral Vein: Abnormal. The vessel is not compressible. There is nonocclusive echogenic material within the common femoral vein consistent with acute thrombus. Saphenofemoral Junction: Extension of thrombus into the saphenofemoral junction. The remainder the great saphenous vein is patent. Profunda Femoral Vein: Extension of occlusive thrombus into the profunda femoral vein. Femoral Vein: Extension of occlusive thrombus into the femoral vein extending throughout the thigh. No evidence of color flow on Doppler imaging. Popliteal Vein: Extension of occlusive thrombus into the popliteal vein. Calf Veins: Occlusive thrombus extends into the calf veins in the proximal calf. Superficial Great Saphenous Vein: No evidence of thrombus. Normal compressibility. Venous Reflux:  None. Other Findings:  None. IMPRESSION: 1. Positive for large volume right lower extremity acute DVT which is  nonocclusive in the common femoral vein but occlusive in the femoral, popliteal and calf veins. 2. Also positive for acute versus chronic nonocclusive thrombus in the contralateral left common femoral vein. Electronically Signed   By: Malachy Moan M.D.   On: 05/14/2017 17:24    EKG: Orders placed or performed in visit on 08/21/06  . EKG 12-Lead    IMPRESSION AND PLAN:  62 year old male patient with history of DVT not compliant on oral Eliquis, COPD, hypertension presented to the emergency room with swelling in the right lower extremity and pain.  Right leg DVT Start patient on anticoagulation with IV heparin drip  Left leg DVT Continue anticoagulation with heparin drip  Emphysema  home dose inhaler treatments  Hypertension Continue oral atenolol  Tobacco cessation counseled with the patient for 6 minutes Nicotine patch offered   All the records are reviewed and case discussed with ED provider. Management plans discussed with the patient, family and they are in agreement.  CODE STATUS: Full code    TOTAL TIME TAKING CARE OF THIS PATIENT: 51 minutes.    Ihor Austin M.D on 05/14/2017 at 8:47 PM  Between 7am to 6pm - Pager - [phone]  After 6pm go to [email or web address] - password [email or web address]  [facility]  Office  [phone]  CC: Primary care physician; Patient, No Pcp Per

## 2017-05-14 NOTE — ED Notes (Signed)
Ellen RN, aware of bed assigned  

## 2017-05-14 NOTE — ED Provider Notes (Signed)
[facility] Emergency Department Provider Note  ____________________________________________   I have reviewed the triage vital signs and the nursing notes.   HISTORY  Chief Complaint Leg Swelling   History limited by: Not Limited   HPI Bradley Dawson is a 62 y.o. male who presents to the emergency department today because of concerns for right leg swelling and pain.  It started 3 days ago.  Located primarily in the calf but radiates up to his knee.  He states he is also noticed some discoloration to his leg with it sometimes turning blue.  Patient does have a history of multiple blood clots in the past and does have IVC filter.  He states that he has not been taking his Eliquis because he is recently moved back to the state has not been able to switch over his insurance.  He denies any chest pain or shortness of breath.  Denies any trauma to the leg.  Denies any recent prolonged travel.    Per medical record review patient has a history of COPD, CAD, DVT.   Past Medical History:  Diagnosis Date  . COPD (chronic obstructive pulmonary disease) (HCC)   . Coronary artery disease   . DVT (deep venous thrombosis) (HCC)   . Hypertension     There are no active problems to display for this patient.   Past Surgical History:  Procedure Laterality Date  . IVC FILTER INSERTION      Prior to Admission medications   Medication Sig Start Date End Date Taking? Authorizing Provider  albuterol (PROVENTIL HFA;VENTOLIN HFA) 108 (90 Base) MCG/ACT inhaler Inhale 2 puffs into the lungs every 6 (six) hours as needed for wheezing.    [provider]  apixaban (ELIQUIS) 5 MG TABS tablet Take 5 mg by mouth 2 (two) times daily.    [provider]  aspirin EC 81 MG tablet Take 81 mg by mouth daily.    [provider]  atenolol (TENORMIN) 25 MG tablet Take 25 mg by mouth daily.    [provider]  atorvastatin (LIPITOR) 40 MG tablet Take 40 mg  by mouth daily with supper.    [provider]  gabapentin (NEURONTIN) 300 MG capsule Take 300 mg by mouth 3 (three) times daily.    [provider]  HYDROcodone-acetaminophen (NORCO) 10-325 MG tablet Take 1 tablet by mouth every 6 (six) hours as needed. 11/05/16   Sharman Cheek, MD  Ipratropium-Albuterol (COMBIVENT IN) Inhale 2 puffs into the lungs every 6 (six) hours as needed (Wheezing).    [provider]  ipratropium-albuterol (DUONEB) 0.5-2.5 (3) MG/3ML SOLN Take 3 mLs by nebulization every 6 (six) hours as needed.    [provider]  isosorbide mononitrate (IMDUR) 60 MG 24 hr tablet Take 60 mg by mouth daily.    [provider]  levocetirizine (XYZAL) 5 MG tablet Take 5 mg by mouth every evening.    [provider]  ranolazine (RANEXA) 1000 MG SR tablet Take 500 mg by mouth 2 (two) times daily.    [provider]  sertraline (ZOLOFT) 50 MG tablet Take 50 mg by mouth daily.    [provider]  traZODone (DESYREL) 50 MG tablet Take 50 mg by mouth at bedtime as needed for sleep.    [provider]  umeclidinium-vilanterol (ANORO ELLIPTA) 62.5-25 MCG/INH AEPB Inhale 1 puff into the lungs daily.    [provider]    Allergies Patient has no known allergies.  No family history on file.  Social History Social History   Tobacco Use  . Smoking status: Current Every Day Smoker    Packs/day: 0.50  . Smokeless tobacco: Never Used  Substance Use Topics  . Alcohol use: No  . Drug use: No    Review of Systems Constitutional: No fever/chills Eyes: No visual changes. ENT: No sore throat. Cardiovascular: Denies chest pain. Respiratory: Denies shortness of breath. Gastrointestinal: No abdominal pain.  No nausea, no vomiting.  No diarrhea.   Genitourinary: Negative for dysuria. Musculoskeletal: Positive for right leg swelling and pain. Skin: Negative for rash. Neurological: Negative for  headaches, focal weakness or numbness.  ____________________________________________   PHYSICAL EXAM:  VITAL SIGNS: ED Triage Vitals  Enc Vitals Group     BP 05/14/17 1614 (!) 131/108     Pulse Rate 05/14/17 1614 (!) 116     Resp 05/14/17 1614 18     Temp 05/14/17 1614 99.5 F (37.5 C)     Temp Source 05/14/17 1614 Oral     SpO2 05/14/17 1614 98 %     Weight 05/14/17 1614 135 lb (61.2 kg)     Height 05/14/17 1614 6\' 1"  (1.854 m)     Head Circumference --      Peak Flow --      Pain Score 05/14/17 1619 9   Constitutional: Alert and oriented. Well appearing and in no distress. Eyes: Conjunctivae are normal.  ENT   Head: Normocephalic and atraumatic.   Nose: No congestion/rhinnorhea.   Mouth/Throat: Mucous membranes are moist.   Neck: No stridor. Hematological/Lymphatic/Immunilogical: No cervical lymphadenopathy. Cardiovascular: Normal rate, regular rhythm.  No murmurs, rubs, or gallops.  Respiratory: Normal respiratory effort without tachypnea nor retractions. Breath sounds are clear and equal bilaterally. No wheezes/rales/rhonchi. Gastrointestinal: Soft and non tender. No rebound. No guarding.  Genitourinary: Deferred Musculoskeletal: Positive for right leg swelling. Dorsalis pulse not palpable however was dopplerable. PT dopplerable.  Neurologic:  Normal speech and language. No gross focal neurologic deficits are appreciated.  Skin:  Skin is warm, dry and intact. No rash noted. Psychiatric: Mood and affect are normal. Speech and behavior are normal. Patient exhibits appropriate insight and judgment.  ____________________________________________    LABS (pertinent positives/negatives)  BMP wnl except ca 8.8 CBC wbc 6.6, hgb 15.6, plt 301  ____________________________________________   EKG  None  ____________________________________________    RADIOLOGY  Right LE US Large  DVT  ____________________________________________   PROCEDURES  Procedures  ____________________________________________   INITIAL IMPRESSION / ASSESSMENT AND PLAN / ED COURSE  Pertinent labs & imaging results that were available during my care of the patient were reviewed by me and considered in my medical decision making (see chart for details).  Patient presented to the emergency department today because of concerns for right leg swelling and pain.  Ultrasound does show a large DVT patient has a history of blood clots.  He has been off of his Eliquis.  Given the large nature of the DVT and that he has noticed some skin color change will plan on admission.  Will start heparin.  Discussed findings and plan with patient.   ____________________________________________   FINAL CLINICAL IMPRESSION(S) / ED DIAGNOSES  Final diagnoses:  Acute deep vein thrombosis (DVT) of proximal vein of right lower extremity (HCC)     Note: This dictation was prepared with Dragon dictation. Any transcriptional errors that result from this process are unintentional     Phineas SemenGoodman, Aalaiyah Yassin, MD 05/15/17 1646

## 2017-05-14 NOTE — Progress Notes (Addendum)
ANTICOAGULATION CONSULT NOTE - Initial Consult  Pharmacy Consult for heparin gtt Indication: DVT  No Known Allergies  Patient Measurements: Height: 6\' 1"  (185.4 cm) Weight: 135 lb (61.2 kg) IBW/kg (Calculated) : 79.9 Heparin Dosing Weight: 61.2kg  Vital Signs: Temp: 99.4 F (37.4 C) (04/25 1945) Temp Source: Oral (04/25 1945) BP: 176/95 (04/25 1945) Pulse Rate: 86 (04/25 1945)  Labs: Recent Labs    05/14/17 1944  HGB 15.6  HCT 47.5  PLT 301    CrCl cannot be calculated (No order found.).   Medical History: Past Medical History:  Diagnosis Date  . COPD (chronic obstructive pulmonary disease) (HCC)   . Coronary artery disease   . DVT (deep venous thrombosis) (HCC)   . Hypertension     Medications:   (Not in a hospital admission) Scheduled:  . heparin  3,600 Units Intravenous Once   Infusions:  . 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   Goal of Therapy:  Heparin level 0.3-0.7 units/ml Monitor platelets by anticoagulation protocol: Yes   Plan:  Give 3600 units bolus x 1 Start heparin infusion at 1000 units/hr Check anti-Xa level in 6 hours and daily while on heparin Continue to monitor H&H and platelets  Gerre PebblesGarrett Mercedes Valeriano 05/14/2017,8:07 PM

## 2017-05-15 ENCOUNTER — Encounter: Payer: Self-pay | Admitting: Emergency Medicine

## 2017-05-15 ENCOUNTER — Encounter
Admission: EM | Disposition: A | Discharge status: Home / Self Care | Payer: Self-pay | Source: Home / Self Care | Attending: Internal Medicine

## 2017-05-15 DIAGNOSIS — R600 Localized edema: Secondary | ICD-10-CM

## 2017-05-15 DIAGNOSIS — M79604 Pain in right leg: Secondary | ICD-10-CM

## 2017-05-15 DIAGNOSIS — I82402 Acute embolism and thrombosis of unspecified deep veins of left lower extremity: Secondary | ICD-10-CM

## 2017-05-15 DIAGNOSIS — I82401 Acute embolism and thrombosis of unspecified deep veins of right lower extremity: Secondary | ICD-10-CM

## 2017-05-15 DIAGNOSIS — I2510 Atherosclerotic heart disease of native coronary artery without angina pectoris: Secondary | ICD-10-CM

## 2017-05-15 HISTORY — PX: PERIPHERAL VASCULAR THROMBECTOMY: CATH118306

## 2017-05-15 LAB — SURGICAL PCR SCREEN
MRSA, PCR: NEGATIVE
Staphylococcus aureus: NEGATIVE

## 2017-05-15 LAB — BASIC METABOLIC PANEL
Anion gap: 6 (ref 5–15)
BUN: 7 mg/dL (ref 6–20)
CALCIUM: 8.6 mg/dL — AB (ref 8.9–10.3)
CHLORIDE: 103 mmol/L (ref 101–111)
CO2: 28 mmol/L (ref 22–32)
CREATININE: 0.85 mg/dL (ref 0.61–1.24)
GFR calc Af Amer: >60 mL/min (ref >60)
GFR calc non Af Amer: >60 mL/min (ref >60)
Glucose, Bld: 94 mg/dL (ref 65–99)
Potassium: 3.4 mmol/L — ABNORMAL LOW (ref 3.5–5.1)
Sodium: 137 mmol/L (ref 135–145)

## 2017-05-15 LAB — CBC
HCT: 43.2 % (ref 40.0–52.0)
HEMOGLOBIN: 14.4 g/dL (ref 13.0–18.0)
MCH: 29.7 pg (ref 26.0–34.0)
MCHC: 33.3 g/dL (ref 32.0–36.0)
MCV: 89.2 fL (ref 80.0–100.0)
PLATELETS: 304 10*3/uL (ref 150–440)
RBC: 4.84 MIL/uL (ref 4.40–5.90)
RDW: 15.1 % — AB (ref 11.5–14.5)
WBC: 5 10*3/uL (ref 3.8–10.6)

## 2017-05-15 LAB — HEPARIN LEVEL (UNFRACTIONATED)
HEPARIN UNFRACTIONATED: 0.23 [IU]/mL — AB (ref 0.30–0.70)
Heparin Unfractionated: 0.27 IU/mL — ABNORMAL LOW (ref 0.30–0.70)
Heparin Unfractionated: 0.16 IU/mL — ABNORMAL LOW (ref 0.30–0.70)
Heparin Unfractionated: 0.18 IU/mL — ABNORMAL LOW (ref 0.30–0.70)

## 2017-05-15 LAB — ANTITHROMBIN III: ANTITHROMB III FUNC: 79 % (ref 75–120)

## 2017-05-15 SURGERY — PERIPHERAL VASCULAR THROMBECTOMY
Anesthesia: Moderate Sedation | Laterality: Right

## 2017-05-15 MED ORDER — ALTEPLASE 2 MG IJ SOLR
INTRAMUSCULAR | Status: AC
  Filled 2017-05-15: qty 6

## 2017-05-15 MED ORDER — LIDOCAINE HCL (PF) 1 % IJ SOLN
INTRAMUSCULAR | Status: DC | PRN
  Administered 2017-05-15: 10 mL

## 2017-05-15 MED ORDER — IOPAMIDOL (ISOVUE-300) INJECTION 61%
INTRAVENOUS | Status: DC | PRN
  Administered 2017-05-15: 40 mL via INTRAVENOUS

## 2017-05-15 MED ORDER — FENTANYL CITRATE (PF) 100 MCG/2ML IJ SOLN
INTRAMUSCULAR | Status: AC
  Filled 2017-05-15: qty 2

## 2017-05-15 MED ORDER — MIDAZOLAM HCL 5 MG/5ML IJ SOLN
INTRAMUSCULAR | Status: AC
  Filled 2017-05-15: qty 5

## 2017-05-15 MED ORDER — SODIUM CHLORIDE 0.9 % IV SOLN: INTRAVENOUS | Status: DC

## 2017-05-15 MED ORDER — MUPIROCIN 2 % EX OINT
1.0000 "application " | TOPICAL_OINTMENT | Freq: Two times a day (BID) | CUTANEOUS | Status: DC
  Filled 2017-05-15: qty 22

## 2017-05-15 MED ORDER — HEPARIN BOLUS VIA INFUSION
2000.0000 [IU] | Freq: Once | INTRAVENOUS | Status: AC
  Administered 2017-05-15: 2000 [IU] via INTRAVENOUS
  Filled 2017-05-15: qty 2000

## 2017-05-15 MED ORDER — CEFAZOLIN SODIUM-DEXTROSE 2-4 GM/100ML-% IV SOLN
2.0000 g | INTRAVENOUS | Status: AC
  Administered 2017-05-15: 2 g via INTRAVENOUS
  Filled 2017-05-15: qty 100

## 2017-05-15 MED ORDER — MIDAZOLAM HCL 2 MG/2ML IJ SOLN
INTRAMUSCULAR | Status: DC | PRN
  Administered 2017-05-15 (×4): 1 mg via INTRAVENOUS
  Administered 2017-05-15: 2 mg via INTRAVENOUS
  Administered 2017-05-15: 1 mg via INTRAVENOUS

## 2017-05-15 MED ORDER — ALTEPLASE 2 MG IJ SOLR
INTRAMUSCULAR | Status: DC | PRN
  Administered 2017-05-15: 10 mg

## 2017-05-15 MED ORDER — HEPARIN SODIUM (PORCINE) 1000 UNIT/ML IJ SOLN
INTRAMUSCULAR | Status: DC | PRN
  Administered 2017-05-15: 4000 [IU] via INTRAVENOUS

## 2017-05-15 MED ORDER — LIDOCAINE-EPINEPHRINE (PF) 1 %-1:200000 IJ SOLN
INTRAMUSCULAR | Status: AC
  Filled 2017-05-15: qty 30

## 2017-05-15 MED ORDER — SODIUM CHLORIDE 0.9 % IV SOLN
INTRAVENOUS | Status: AC
  Administered 2017-05-15 – 2017-05-16 (×2): via INTRAVENOUS

## 2017-05-15 MED ORDER — FENTANYL CITRATE (PF) 100 MCG/2ML IJ SOLN
INTRAMUSCULAR | Status: DC | PRN
  Administered 2017-05-15: 25 ug via INTRAVENOUS
  Administered 2017-05-15: 50 ug via INTRAVENOUS
  Administered 2017-05-15 (×3): 25 ug via INTRAVENOUS
  Administered 2017-05-15: 50 ug via INTRAVENOUS

## 2017-05-15 MED ORDER — CEFAZOLIN SODIUM-DEXTROSE 2-4 GM/100ML-% IV SOLN
INTRAVENOUS | Status: AC
Start: 2017-05-15 — End: 2017-05-16
  Filled 2017-05-15: qty 100

## 2017-05-15 MED ORDER — HEPARIN BOLUS VIA INFUSION
900.0000 [IU] | Freq: Once | INTRAVENOUS | Status: AC
  Administered 2017-05-15: 900 [IU] via INTRAVENOUS
  Filled 2017-05-15: qty 900

## 2017-05-15 MED ORDER — MIDAZOLAM HCL 2 MG/2ML IJ SOLN
INTRAMUSCULAR | Status: AC
  Filled 2017-05-15: qty 2

## 2017-05-15 MED ORDER — HEPARIN (PORCINE) IN NACL 1000-0.9 UT/500ML-% IV SOLN
INTRAVENOUS | Status: AC
  Filled 2017-05-15: qty 1000

## 2017-05-15 MED ORDER — MORPHINE SULFATE (PF) 2 MG/ML IV SOLN
2.0000 mg | INTRAVENOUS | Status: DC | PRN
  Administered 2017-05-15 – 2017-05-16 (×3): 2 mg via INTRAVENOUS
  Filled 2017-05-15 (×3): qty 1

## 2017-05-15 MED ORDER — HEPARIN SODIUM (PORCINE) 1000 UNIT/ML IJ SOLN
INTRAMUSCULAR | Status: AC
  Filled 2017-05-15: qty 1

## 2017-05-15 MED ORDER — POTASSIUM CHLORIDE 10 MEQ/100ML IV SOLN
10.0000 meq | INTRAVENOUS | Status: AC
  Administered 2017-05-15 (×4): 10 meq via INTRAVENOUS
  Filled 2017-05-15 (×4): qty 100

## 2017-05-15 SURGICAL SUPPLY — 19 items
BALLN DORADO 10X80X80 (BALLOONS) ×3
BALLN ULTRVRSE 130X300X6 (BALLOONS) ×1
BALLN ULTRVRSE 6X300X130 (BALLOONS) ×2
BALLOON DORADO 10X80X80 (BALLOONS) ×1 IMPLANT
BALLOON ULTRVRSE 130X300X6 (BALLOONS) ×1 IMPLANT
CANISTER PENUMBRA ENGINE (MISCELLANEOUS) ×3 IMPLANT
CANNULA 5F STIFF (CANNULA) ×3 IMPLANT
CATH BEACON 5 .035 65 KMP TIP (CATHETERS) ×3 IMPLANT
CATH CXI 4F 90 DAV (CATHETERS) ×3 IMPLANT
CATH INDIGO CAT8 TORQ 85 KIT (CATHETERS) ×3 IMPLANT
CATH INFUS 90CMX50CM (CATHETERS) ×2
CATH INFUS UNIFUSE 90X50 5FR (CATHETERS) ×1 IMPLANT
DEVICE PRESTO INFLATION (MISCELLANEOUS) ×6 IMPLANT
GLIDEWIRE ADV .035X180CM (WIRE) ×3 IMPLANT
PACK ANGIOGRAPHY (CUSTOM PROCEDURE TRAY) ×3 IMPLANT
SHEATH BRITE TIP 8FRX11 (SHEATH) ×3 IMPLANT
WIRE AMPLATZ SSTIFF .035X260CM (WIRE) ×3 IMPLANT
WIRE MAGIC TOR.035 180C (WIRE) ×3 IMPLANT
WIRE MAGIC TORQUE 260C (WIRE) ×3 IMPLANT

## 2017-05-15 NOTE — Op Note (Signed)
VEIN AND VASCULAR SURGERY   OPERATIVE NOTE   PRE-OPERATIVE DIAGNOSIS: extensive right leg DVT.  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 US guidance for vascular access to the right popliteal vein 2. Catheter placement into IVC from the right popliteal vein 3. IVC gram and right lower extremity venogram 4.   Catheter directed thrombolysis with 10 mg of TPA to the right superficial femoral vein, common femoral vein, iliac vein, and inferior vena cava 6. Mechanical thrombectomy to right popliteal vein, superficial femoral vein, common femoral vein, iliac vein, and inferior vena cava with the penumbra cat 8 device 7. PTA of right popliteal vein, superficial femoral vein, and common femoral vein with 6 mm balloon 8. PTA of right common femoral vein, iliac veins, and up to the IVC with 10 mm balloon   SURGEON: Leotis Pain, MD  ASSISTANT(S): none  ANESTHESIA: local with moderate conscious sedation for 75 minutes using 7 mg of Versed and 200 Mcg of Fentanyl  ESTIMATED BLOOD LOSS: 150 cc  FINDING(S): 1. 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 male who presents with new right lower extremity DVT.  He has a long history of multiple previous DVTs mostly on the left. Patient has marked leg swelling and pain. Venous intervention is performed to reduce the symtpoms and avoid long term postphlebitic symptoms.   DESCRIPTION: After obtaining full informed written consent, the patient was brought back to the vascular suite and placed supine upon the table.Moderate conscious sedation was administered during a face to face encounter with the patient throughout the procedure with my supervision of the RN administering  medicines and monitoring the patient's vital signs, pulse oximetry, telemetry and mental status throughout from the start of the procedure until the patient was taken to the recovery room. After obtaining adequate anesthesia, the patient was prepped and draped in the standard fashion. The patient was then placed into the prone position. The right popliteal vein was then accessed under direct ultrasound guidance without difficulty with a micropuncture needle and a permanent image was recorded. I then upsized to an 8Fr sheath over a J wire. 4000 units of heparin were then given. Imaging showed extensive DVT with minimal flow.  A lot of this was completely occlusive and it was difficult to even get a catheter to track which would be consistent with much of this being chronic.  Eventually, a Kumpe catheter was used but had to be exchanged for a CXI catheter and an advantage wire were then advanced into the CFV and images were performed. Large pelvic collaterals were seen and complete occlusion of the right iliac veins and apparently the inferior vena cava up to the filter was seen. I was able to cross the thrombus and stenosis and advance into the IVC. I then used a 6 mm diameter by 30 cm length angioplasty balloon to treat the right popliteal vein, superficial femoral vein, up to the common femoral vein inflating this to 8 atm for 1 minute.  It was then advanced up into the iliac veins and IVC to help for a channel and allow treatment.  The second inflation most 12 atm for 1 minute.  I then used the 90 cm total length 50 cm working length thrombolytic catheter and instilled 10 of  tpa throughout the right superficial femoral, common femoral, and iliac veins.  The most proximal tip was just into the IVC.  After this dwelled for 15-20 minutes, I used the Penumbra Cat 8 catheter and evacuated about 150 cc of effluent with mechanical thrombectomy throughout the iliac veins, CFV, SFV, and popliteal vein. This had  mild to moderate improvement. There was now a good channel of blood flow up to the common femoral vein. I then turned my attention to the iliac veins. The stenosis/occlusion and thrombus was treated with a 10 mm diameter by 8 cm length high-pressure angioplasty balloon.  3 inflations were performed starting in the distal inferior vena cava back through the iliac veins all the way down to the common femoral vein on the most distal inflation.  Each inflation was from 10 to 12 atm for 1 minute.  Completion angiogram following this demonstrated forward flow through the iliac veins although I am mild to moderate amount of residual thrombus and stenosis remained.  This was a marked improvement.  I then elected to terminate the procedure. The sheath was removed and a dressing was placed. She was taken to the recovery room in stable condition having tolerated the procedure well.   COMPLICATIONS: None  CONDITION: Stable  Leotis Pain 05/15/2017 3:25 PM

## 2017-05-15 NOTE — Progress Notes (Signed)
ANTICOAGULATION CONSULT NOTE - Initial Consult  Pharmacy Consult for heparin gtt Indication: DVT  No Known Allergies  Patient Measurements: Height: 6\' 1"  (185.4 cm) Weight: 160 lb (72.6 kg) IBW/kg (Calculated) : 79.9 Heparin Dosing Weight: 61.2kg  Vital Signs: Temp: 98.3 F (36.8 C) (04/26 1933) Temp Source: Oral (04/26 1933) BP: 137/74 (04/26 1933) Pulse Rate: 91 (04/26 1933)  Labs: Recent Labs    05/14/17 1944 05/14/17 2037 05/15/17 0409 05/15/17 1101 05/15/17 1900  HGB 15.6  --  14.4  --   --   HCT 47.5  --  43.2  --   --   PLT 301  --  304  --   --   APTT  --  30  --   --   --   LABPROT  --  13.2  --   --   --   INR  --  1.01  --   --   --   HEPARINUNFRC  --   --  0.27* 0.16* 0.18*  CREATININE 0.75  --  0.85  --   --     Estimated Creatinine Clearance: 93.7 mL/min (by C-G formula based on SCr of 0.85 mg/dL).   Medical History: Past Medical History:  Diagnosis Date  . COPD (chronic obstructive pulmonary disease) (HCC)   . Coronary artery disease   . DVT (deep venous thrombosis) (HCC)   . Hypertension     Medications:  Medications Prior to Admission  Medication Sig Dispense Refill Last Dose  . albuterol (PROVENTIL HFA;VENTOLIN HFA) 108 (90 Base) MCG/ACT inhaler Inhale 2 puffs into the lungs every 6 (six) hours as needed for wheezing.   PRN at PRN  . apixaban (ELIQUIS) 5 MG TABS tablet Take 5 mg by mouth 2 (two) times daily.   Past Month at Unknown time  . aspirin EC 81 MG tablet Take 81 mg by mouth daily.   Past Month at Unknown time  . atenolol (TENORMIN) 25 MG tablet Take 25 mg by mouth daily.   Past Month at Unknown time  . atorvastatin (LIPITOR) 40 MG tablet Take 40 mg by mouth daily with supper.   Past Month at Unknown time  . gabapentin (NEURONTIN) 300 MG capsule Take 300 mg by mouth 3 (three) times daily.   Past Month at Unknown time  . HYDROcodone-acetaminophen (NORCO) 10-325 MG tablet Take 1 tablet by mouth every 6 (six) hours as needed. 12 tablet  0   . Ipratropium-Albuterol (COMBIVENT IN) Inhale 2 puffs into the lungs every 6 (six) hours as needed (Wheezing).   PRN at PRN  . ipratropium-albuterol (DUONEB) 0.5-2.5 (3) MG/3ML SOLN Take 3 mLs by nebulization every 6 (six) hours as needed.   PRN at PRN  . isosorbide mononitrate (IMDUR) 60 MG 24 hr tablet Take 60 mg by mouth daily.   Past Month at Unknown time  . levocetirizine (XYZAL) 5 MG tablet Take 5 mg by mouth every evening.   Past Month at Unknown time  . ranolazine (RANEXA) 1000 MG SR tablet Take 500 mg by mouth 2 (two) times daily.   Past Month at Unknown time  . sertraline (ZOLOFT) 50 MG tablet Take 50 mg by mouth daily.   Past Month at Unknown time  . traZODone (DESYREL) 50 MG tablet Take 50 mg by mouth at bedtime as needed for sleep.   PRN at PRN  . umeclidinium-vilanterol (ANORO ELLIPTA) 62.5-25 MCG/INH AEPB Inhale 1 puff into the lungs daily.   Past Month at Unknown time  Scheduled:  . amLODipine  5 mg Oral Daily  . aspirin EC  81 mg Oral Daily  . atenolol  25 mg Oral Daily  . atorvastatin  40 mg Oral Q supper  . docusate sodium  100 mg Oral BID  . gabapentin  300 mg Oral TID  . isosorbide mononitrate  60 mg Oral Daily  . loratadine  10 mg Oral QPM  . nicotine  21 mg Transdermal Daily  . ranolazine  500 mg Oral BID  . sertraline  50 mg Oral Daily  . sodium chloride flush  3 mL Intravenous Q12H  . umeclidinium-vilanterol  1 puff Inhalation Daily   Infusions:  . sodium chloride    . sodium chloride 75 mL/hr at 05/15/17 0856  . 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  HL below goal but  infusion paused.   Goal of Therapy:  Heparin level 0.3-0.7 units/ml Monitor platelets by anticoagulation protocol: Yes   Plan:  Heparin infusion paused and resumed at 1630. Will repeat level at 2230.   Luisa HartScott Dawanda Mapel, PharmD Clinical Pharmacist  05/15/2017

## 2017-05-15 NOTE — Progress Notes (Signed)
RN notified Dr. Caryn BeeMaier of patient BP of 176/82. New order put in at this time.

## 2017-05-15 NOTE — Progress Notes (Signed)
ANTICOAGULATION CONSULT NOTE - Initial Consult  Pharmacy Consult for heparin gtt Indication: DVT  No Known Allergies  Patient Measurements: Height: 6\' 1"  (185.4 cm) Weight: 135 lb (61.2 kg) IBW/kg (Calculated) : 79.9 Heparin Dosing Weight: 61.2kg  Vital Signs: Temp: 97.8 F (36.6 C) (04/26 0513) Temp Source: Oral (04/26 0513) BP: 140/85 (04/26 0513) Pulse Rate: 80 (04/26 0513)  Labs: Recent Labs    05/14/17 1944 05/14/17 2037 05/15/17 0409  HGB 15.6  --  14.4  HCT 47.5  --  43.2  PLT 301  --  304  APTT  --  30  --   LABPROT  --  13.2  --   INR  --  1.01  --   HEPARINUNFRC  --   --  0.27*  CREATININE 0.75  --  0.85    Estimated Creatinine Clearance: 79 mL/min (by C-G formula based on SCr of 0.85 mg/dL).   Medical History: Past Medical History:  Diagnosis Date  . COPD (chronic obstructive pulmonary disease) (HCC)   . Coronary artery disease   . DVT (deep venous thrombosis) (HCC)   . Hypertension     Medications:  Medications Prior to Admission  Medication Sig Dispense Refill Last Dose  . albuterol (PROVENTIL HFA;VENTOLIN HFA) 108 (90 Base) MCG/ACT inhaler Inhale 2 puffs into the lungs every 6 (six) hours as needed for wheezing.   PRN at PRN  . apixaban (ELIQUIS) 5 MG TABS tablet Take 5 mg by mouth 2 (two) times daily.   Past Month at Unknown time  . aspirin EC 81 MG tablet Take 81 mg by mouth daily.   Past Month at Unknown time  . atenolol (TENORMIN) 25 MG tablet Take 25 mg by mouth daily.   Past Month at Unknown time  . atorvastatin (LIPITOR) 40 MG tablet Take 40 mg by mouth daily with supper.   Past Month at Unknown time  . gabapentin (NEURONTIN) 300 MG capsule Take 300 mg by mouth 3 (three) times daily.   Past Month at Unknown time  . HYDROcodone-acetaminophen (NORCO) 10-325 MG tablet Take 1 tablet by mouth every 6 (six) hours as needed. 12 tablet 0   . Ipratropium-Albuterol (COMBIVENT IN) Inhale 2 puffs into the lungs every 6 (six) hours as needed  (Wheezing).   PRN at PRN  . ipratropium-albuterol (DUONEB) 0.5-2.5 (3) MG/3ML SOLN Take 3 mLs by nebulization every 6 (six) hours as needed.   PRN at PRN  . isosorbide mononitrate (IMDUR) 60 MG 24 hr tablet Take 60 mg by mouth daily.   Past Month at Unknown time  . levocetirizine (XYZAL) 5 MG tablet Take 5 mg by mouth every evening.   Past Month at Unknown time  . ranolazine (RANEXA) 1000 MG SR tablet Take 500 mg by mouth 2 (two) times daily.   Past Month at Unknown time  . sertraline (ZOLOFT) 50 MG tablet Take 50 mg by mouth daily.   Past Month at Unknown time  . traZODone (DESYREL) 50 MG tablet Take 50 mg by mouth at bedtime as needed for sleep.   PRN at PRN  . umeclidinium-vilanterol (ANORO ELLIPTA) 62.5-25 MCG/INH AEPB Inhale 1 puff into the lungs daily.   Past Month at Unknown time   Scheduled:  . amLODipine  5 mg Oral Daily  . aspirin EC  81 mg Oral Daily  . atenolol  25 mg Oral Daily  . atorvastatin  40 mg Oral Q supper  . docusate sodium  100 mg Oral BID  .  gabapentin  300 mg Oral TID  . isosorbide mononitrate  60 mg Oral Daily  . loratadine  10 mg Oral QPM  . nicotine  21 mg Transdermal Daily  . ranolazine  500 mg Oral BID  . sertraline  50 mg Oral Daily  . sodium chloride flush  3 mL Intravenous Q12H  . umeclidinium-vilanterol  1 puff Inhalation Daily   Infusions:  . sodium chloride    . 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   Goal of Therapy:  Heparin level 0.3-0.7 units/ml Monitor platelets by anticoagulation protocol: Yes   Plan:  Give 3600 units bolus x 1 Start heparin infusion at 1000 units/hr Check anti-Xa level in 6 hours and daily while on heparin Continue to monitor H&H and platelets   04/26 @ 0400 HL 0.27 subtherapeutic. Will rebolus w/ heparin 900 units IV x 1  and will will increase rate to 1100 units/hr and will recheck HL @ 1000. CBC stable.  Thomasene Rippleavid Zakariyya Helfman, PharmD, BCPS Clinical Pharmacist 05/15/2017

## 2017-05-15 NOTE — H&P (Signed)
[facility] History & Physical Update  The patient was interviewed and re-examined.  The patient's previous History and Physical has been reviewed and is unchanged.  There is no change in the plan of care. We plan to proceed with the scheduled procedure.  Festus BarrenJason Oreatha Fabry, MD  05/15/2017, 1:50 PM

## 2017-05-15 NOTE — Consult Note (Signed)
[facility] Vascular Consult Note  MRN : [id number]  Bradley Dawson is a 62 y.o. (13-Jul-1955) male who presents with chief complaint of  Chief Complaint  Patient presents with  . Leg Swelling  .  History of Present Illness: I am asked to see the patient by Dr. Juliene PinaMody for evaluation of RLE DVT.  The patient has been having worsening pain and swelling in his right leg for about 4 days.  He denies any trauma, injury, or precipitating event.  No recent surgery or long travel.  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  No ulceration or infection.  No fevers or chills.  He came to the emergency department and an ultrasound shows extensive right lower extremity DVT from the popliteal vein up through the common femoral vein.  Our institution does not really image the iliac veins, so there is no mention of that.  He was started on heparin and has been elevating his leg in the bed which has helped slightly.  We are consulted for further evaluation and treatment  Current Facility-Administered Medications  Medication Dose Route Frequency Provider Last Rate Last Dose  . 0.9 %  sodium chloride infusion  250 mL Intravenous PRN Pyreddy, Pavan, MD      . 0.9 %  sodium chloride infusion   Intravenous Continuous Adrian SaranMody, Sital, MD 75 mL/hr at 05/15/17 0856    . acetaminophen (TYLENOL) tablet 650 mg  650 mg Oral Q6H PRN Ihor AustinPyreddy, Pavan, MD       Or  . acetaminophen (TYLENOL) suppository 650 mg  650 mg Rectal Q6H PRN Pyreddy, Pavan, MD      . albuterol (PROVENTIL) (2.5 MG/3ML) 0.083% nebulizer solution 2.5 mg  2.5 mg Inhalation Q6H PRN Pyreddy, Pavan, MD      . amLODipine (NORVASC) tablet 5 mg  5 mg Oral Daily Cammy CopaMaier, Angela, MD   5 mg at 05/14/17 2321  . aspirin EC tablet 81 mg  81 mg Oral Daily Pyreddy, Pavan, MD      . atenolol (TENORMIN) tablet 25 mg  25 mg Oral Daily Pyreddy, Pavan, MD      . atorvastatin (LIPITOR) tablet 40  mg  40 mg Oral Q supper Pyreddy, Pavan, MD      . docusate sodium (COLACE) capsule 100 mg  100 mg Oral BID Ihor AustinPyreddy, Pavan, MD   100 mg at 05/14/17 2233  . gabapentin (NEURONTIN) capsule 300 mg  300 mg Oral TID Ihor AustinPyreddy, Pavan, MD   300 mg at 05/14/17 2233  . heparin ADULT infusion 100 units/mL (25000 units/26950mL sodium chloride 0.45%)  1,100 Units/hr Intravenous Continuous Ihor AustinPyreddy, Pavan, MD 11 mL/hr at 05/15/17 0559 1,100 Units/hr at 05/15/17 0559  . HYDROcodone-acetaminophen (NORCO) 10-325 MG per tablet 1 tablet  1 tablet Oral Q6H PRN Ihor AustinPyreddy, Pavan, MD   1 tablet at 05/15/17 0506  . ipratropium-albuterol (DUONEB) 0.5-2.5 (3) MG/3ML nebulizer solution 3 mL  3 mL Nebulization Q6H PRN Pyreddy, Pavan, MD      . isosorbide mononitrate (IMDUR) 24 hr tablet 60 mg  60 mg Oral Daily Pyreddy, Pavan, MD      . loratadine (CLARITIN) tablet 10 mg  10 mg Oral QPM Pyreddy, Vivien RotaPavan, MD   10 mg at 05/14/17 2233  . mupirocin ointment (BACTROBAN) 2 % 1 application  1 application Nasal BID Mody, Sital, MD      . nicotine (NICODERM CQ - dosed in mg/24  hours) patch 21 mg  21 mg Transdermal Daily Pyreddy, Vivien Rota, MD   21 mg at 05/14/17 2234  . ondansetron (ZOFRAN) tablet 4 mg  4 mg Oral Q6H PRN Pyreddy, Vivien Rota, MD       Or  . ondansetron (ZOFRAN) injection 4 mg  4 mg Intravenous Q6H PRN Pyreddy, Pavan, MD      . potassium chloride 10 mEq in 100 mL IVPB  10 mEq Intravenous Q1 Hr x 4 Mody, Sital, MD 100 mL/hr at 05/15/17 0856 10 mEq at 05/15/17 0856  . ranolazine (RANEXA) 12 hr tablet 500 mg  500 mg Oral BID Ihor Austin, MD   500 mg at 05/14/17 2233  . sertraline (ZOLOFT) tablet 50 mg  50 mg Oral Daily Pyreddy, Pavan, MD      . sodium chloride flush (NS) 0.9 % injection 3 mL  3 mL Intravenous Q12H Pyreddy, Pavan, MD      . sodium chloride flush (NS) 0.9 % injection 3 mL  3 mL Intravenous PRN Pyreddy, Pavan, MD      . traZODone (DESYREL) tablet 50 mg  50 mg Oral QHS PRN Ihor Austin, MD   50 mg at 05/14/17 2233  .  umeclidinium-vilanterol (ANORO ELLIPTA) 62.5-25 MCG/INH 1 puff  1 puff Inhalation Daily Ihor Austin, MD        Past Medical History:  Diagnosis Date  . COPD (chronic obstructive pulmonary disease) (HCC)   . Coronary artery disease   . DVT (deep venous thrombosis) (HCC)   . Hypertension     Past Surgical History:  Procedure Laterality Date  . IVC FILTER INSERTION    Left foot and ankle surgeries  Social History Social History   Tobacco Use  . Smoking status: Current Every Day Smoker    Packs/day: 0.50  . Smokeless tobacco: Never Used  Substance Use Topics  . Alcohol use: No  . Drug use: No    Family History Family History  Problem Relation Age of Onset  . Pulmonary embolism Mother   . Cancer Father   No aneurysms.  No autoimmune diseases.  No porphyria  No Known Allergies   REVIEW OF SYSTEMS (Negative unless checked)  Constitutional: [] Weight loss  [] Fever  [] Chills Cardiac: [] Chest pain   [] Chest pressure   [] Palpitations   [] Shortness of breath when laying flat   [] Shortness of breath at rest   [] Shortness of breath with exertion. Vascular:  [] Pain in legs with walking   [] Pain in legs at rest   [] Pain in legs when laying flat   [] Claudication   [] Pain in feet when walking  [] Pain in feet at rest  [] Pain in feet when laying flat   [x] History of DVT   [] Phlebitis   [x] Swelling in legs   [] Varicose veins   [] Non-healing ulcers Pulmonary:   [] Uses home oxygen   [] Productive cough   [] Hemoptysis   [] Wheeze  [x] COPD   [] Asthma Neurologic:  [] Dizziness  [] Blackouts   [] Seizures   [] History of stroke   [] History of TIA  [] Aphasia   [] Temporary blindness   [] Dysphagia   [] Weakness or numbness in arms   [] Weakness or numbness in legs Musculoskeletal:  [x] Arthritis   [x] Joint swelling   [] Joint pain   [] Low back pain Hematologic:  [] Easy bruising  [] Easy bleeding   [] Hypercoagulable state   [] Anemic  [] Hepatitis Gastrointestinal:  [] Blood in stool   [] Vomiting blood   [] Gastroesophageal reflux/heartburn   [] Difficulty swallowing. Genitourinary:  [] Chronic kidney disease   [] Difficult urination  []   Frequent urination  [] Burning with urination   [] Blood in urine Skin:  [] Rashes   [] Ulcers   [] Wounds Psychological:  [] History of anxiety   []  History of major depression.  Physical Examination  Vitals:   05/14/17 2000 05/14/17 2100 05/14/17 2202 05/15/17 0513  BP: (!) 164/96 (!) 170/91 (!) 176/82 140/85  Pulse: 83 89 89 80  Resp:   20 20  Temp:   98.5 F (36.9 C) 97.8 F (36.6 C)  TempSrc:   Oral Oral  SpO2: 100% 99% 100% 100%  Weight:   73 kg (160 lb 15 oz)   Height:   6\' 1"  (1.854 m)    Body mass index is 21.23 kg/m. Gen:  WD/WN, NAD.  Appears older than stated age Head: /AT, No temporalis wasting.  Ear/Nose/Throat: Hearing grossly intact, nares w/o erythema or drainage, oropharynx w/o Erythema/Exudate Eyes: Sclera non-icteric, conjunctiva clear Neck: Trachea midline.  No JVD.  Pulmonary:  Good air movement, respirations not labored, equal bilaterally.  Cardiac: RRR, no JVD Vascular:  Vessel Right Left  Radial Palpable Palpable                          PT  1+ palpable  trace palpable  DP  1+ palpable  1+ palpable   Gastrointestinal: soft, non-tender/non-distended. No guarding/reflex.  Musculoskeletal: M/S 5/5 throughout.  Extremities without ischemic changes.  Several surgical scars on the left foot and ankle.  1-2+ left lower extremity swelling and mild to moderate stasis dermatitis.  2+ right lower extremity swelling with some venous congestion seen.  Capillary refill is present bilaterally. Neurologic: Sensation grossly intact in extremities.  Symmetrical.  Speech is fluent. Motor exam as listed above. Psychiatric: Judgment intact, Mood & affect appropriate for pt's clinical situation. Dermatologic: No rashes or ulcers noted.  No cellulitis or open wounds.       CBC Lab Results  Component Value Date   WBC 5.0 05/15/2017    HGB 14.4 05/15/2017   HCT 43.2 05/15/2017   MCV 89.2 05/15/2017   PLT 304 05/15/2017    BMET    Component Value Date/Time   NA 137 05/15/2017 0409   K 3.4 (L) 05/15/2017 0409   CL 103 05/15/2017 0409   CO2 28 05/15/2017 0409   GLUCOSE 94 05/15/2017 0409   BUN 7 05/15/2017 0409   CREATININE 0.85 05/15/2017 0409   CALCIUM 8.6 (L) 05/15/2017 0409   GFRNONAA >60 05/15/2017 0409   GFRAA >60 05/15/2017 0409   Estimated Creatinine Clearance: 94.2 mL/min (by C-G formula based on SCr of 0.85 mg/dL).  COAG Lab Results  Component Value Date   INR 1.01 05/14/2017    Radiology US Venous Img Lower Unilateral Right  Result Date: 05/14/2017 CLINICAL DATA:  62 year old male with right lower extremity swelling for the past 3 days. Prior history left lower extremity DVT EXAM: RIGHT LOWER EXTREMITY VENOUS DOPPLER ULTRASOUND TECHNIQUE: Gray-scale sonography with graded compression, as well as color Doppler and duplex ultrasound were performed to evaluate the lower extremity deep venous systems from the level of the common femoral vein and including the common femoral, femoral, profunda femoral, popliteal and calf veins including the posterior tibial, peroneal and gastrocnemius veins when visible. The superficial great saphenous vein was also interrogated. Spectral Doppler was utilized to evaluate flow at rest and with distal augmentation maneuvers in the common femoral, femoral and popliteal veins. COMPARISON:  None. FINDINGS: Contralateral Common Femoral Vein: Abnormal contralateral left common femoral vein.  The vessel is partially compressible. There is internal echogenic material but fairly significant flow on color Doppler imaging. Common Femoral Vein: Abnormal. The vessel is not compressible. There is nonocclusive echogenic material within the common femoral vein consistent with acute thrombus. Saphenofemoral Junction: Extension of thrombus into the saphenofemoral junction. The remainder the great  saphenous vein is patent. Profunda Femoral Vein: Extension of occlusive thrombus into the profunda femoral vein. Femoral Vein: Extension of occlusive thrombus into the femoral vein extending throughout the thigh. No evidence of color flow on Doppler imaging. Popliteal Vein: Extension of occlusive thrombus into the popliteal vein. Calf Veins: Occlusive thrombus extends into the calf veins in the proximal calf. Superficial Great Saphenous Vein: No evidence of thrombus. Normal compressibility. Venous Reflux:  None. Other Findings:  None. IMPRESSION: 1. Positive for large volume right lower extremity acute DVT which is nonocclusive in the common femoral vein but occlusive in the femoral, popliteal and calf veins. 2. Also positive for acute versus chronic nonocclusive thrombus in the contralateral left common femoral vein. Electronically Signed   By: Malachy Moan M.D.   On: 05/14/2017 17:24      Assessment/Plan 1.  Extensive, highly symptomatic right lower extremity DVT.  The patient has multiple previous left lower extremity DVTs and has chronic pain and swelling in the left leg secondary to this.  He understands postphlebitic symptoms firsthand.  It would certainly be reasonable to consider intervention for his right lower extremity venous disease at this time.  He already has a filter in place.  Venous thrombectomy and thrombolytic therapy discussed with the patient.  The risks and benefits were discussed.  The patient desires to proceed and this has been scheduled for later this afternoon. 2.  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  Multiple previous DVTs.  At this point, no intervention would be of benefit.  Recommend compression stockings and elevation. 3.  Hypertension.  Stable on outpatient medications and blood pressure control important in reducing the progression of atherosclerotic disease. On appropriate oral medications. 4.  Coronary disease.  Monitor cardiac rhythm and telemetry  during his procedure.  Use nitrates as needed.  Will be on oxygen during the procedure as well as anticoagulation 5.  Tobacco use.  Likely increases his clotting potential.  Cessation would be of benefit.   Festus Barren, MD  05/15/2017 10:14 AM    This note was created with Dragon medical transcription system.  Any error is purely unintentional

## 2017-05-15 NOTE — Progress Notes (Signed)
ANTICOAGULATION CONSULT NOTE - Initial Consult  Pharmacy Consult for heparin gtt Indication: DVT  No Known Allergies  Patient Measurements: Height: 6\' 1"  (185.4 cm) Weight: 160 lb 15 oz (73 kg) IBW/kg (Calculated) : 79.9 Heparin Dosing Weight: 61.2kg  Vital Signs: Temp: 97.8 F (36.6 C) (04/26 0513) Temp Source: Oral (04/26 0513) BP: 140/85 (04/26 0513) Pulse Rate: 80 (04/26 0513)  Labs: Recent Labs    05/14/17 1944 05/14/17 2037 05/15/17 0409 05/15/17 1101  HGB 15.6  --  14.4  --   HCT 47.5  --  43.2  --   PLT 301  --  304  --   APTT  --  30  --   --   LABPROT  --  13.2  --   --   INR  --  1.01  --   --   HEPARINUNFRC  --   --  0.27* 0.16*  CREATININE 0.75  --  0.85  --     Estimated Creatinine Clearance: 94.2 mL/min (by C-G formula based on SCr of 0.85 mg/dL).   Medical History: Past Medical History:  Diagnosis Date  . COPD (chronic obstructive pulmonary disease) (HCC)   . Coronary artery disease   . DVT (deep venous thrombosis) (HCC)   . Hypertension     Medications:  Medications Prior to Admission  Medication Sig Dispense Refill Last Dose  . albuterol (PROVENTIL HFA;VENTOLIN HFA) 108 (90 Base) MCG/ACT inhaler Inhale 2 puffs into the lungs every 6 (six) hours as needed for wheezing.   PRN at PRN  . apixaban (ELIQUIS) 5 MG TABS tablet Take 5 mg by mouth 2 (two) times daily.   Past Month at Unknown time  . aspirin EC 81 MG tablet Take 81 mg by mouth daily.   Past Month at Unknown time  . atenolol (TENORMIN) 25 MG tablet Take 25 mg by mouth daily.   Past Month at Unknown time  . atorvastatin (LIPITOR) 40 MG tablet Take 40 mg by mouth daily with supper.   Past Month at Unknown time  . gabapentin (NEURONTIN) 300 MG capsule Take 300 mg by mouth 3 (three) times daily.   Past Month at Unknown time  . HYDROcodone-acetaminophen (NORCO) 10-325 MG tablet Take 1 tablet by mouth every 6 (six) hours as needed. 12 tablet 0   . Ipratropium-Albuterol (COMBIVENT IN) Inhale  2 puffs into the lungs every 6 (six) hours as needed (Wheezing).   PRN at PRN  . ipratropium-albuterol (DUONEB) 0.5-2.5 (3) MG/3ML SOLN Take 3 mLs by nebulization every 6 (six) hours as needed.   PRN at PRN  . isosorbide mononitrate (IMDUR) 60 MG 24 hr tablet Take 60 mg by mouth daily.   Past Month at Unknown time  . levocetirizine (XYZAL) 5 MG tablet Take 5 mg by mouth every evening.   Past Month at Unknown time  . ranolazine (RANEXA) 1000 MG SR tablet Take 500 mg by mouth 2 (two) times daily.   Past Month at Unknown time  . sertraline (ZOLOFT) 50 MG tablet Take 50 mg by mouth daily.   Past Month at Unknown time  . traZODone (DESYREL) 50 MG tablet Take 50 mg by mouth at bedtime as needed for sleep.   PRN at PRN  . umeclidinium-vilanterol (ANORO ELLIPTA) 62.5-25 MCG/INH AEPB Inhale 1 puff into the lungs daily.   Past Month at Unknown time   Scheduled:  . amLODipine  5 mg Oral Daily  . aspirin EC  81 mg Oral Daily  .  atenolol  25 mg Oral Daily  . atorvastatin  40 mg Oral Q supper  . docusate sodium  100 mg Oral BID  . gabapentin  300 mg Oral TID  . isosorbide mononitrate  60 mg Oral Daily  . loratadine  10 mg Oral QPM  . mupirocin ointment  1 application Nasal BID  . nicotine  21 mg Transdermal Daily  . ranolazine  500 mg Oral BID  . sertraline  50 mg Oral Daily  . sodium chloride flush  3 mL Intravenous Q12H  . umeclidinium-vilanterol  1 puff Inhalation Daily   Infusions:  . sodium chloride    . sodium chloride 75 mL/hr at 05/15/17 0856  . heparin 1,100 Units/hr (05/15/17 0559)   PRN:  Anti-infectives (From admission, onward)   None      Assessment: 62 year old male with recurrent dvt's, quit taking eliquis when he arrived in this state october 2018, developed dvt.   Goal of Therapy:  Heparin level 0.3-0.7 units/ml Monitor platelets by anticoagulation protocol: Yes   Plan:  Give 3600 units bolus x 1 Start heparin infusion at 1000 units/hr Check anti-Xa level in 6 hours  and daily while on heparin Continue to monitor H&H and platelets   04/26 @ 0400 HL 0.27 subtherapeutic. Will rebolus w/ heparin 900 units IV x 1 and will will increase rate to 1100 units/hr and will recheck HL @ 1000. CBC stable.  04/26 @1130  HL 0.16 subtherapeutic. Will bolus w/heparin 2000 units and increase the infusion to 1300 units/hr. Recheck HL in 6 hours  Gardner Candle, PharmD, BCPS Clinical Pharmacist 05/15/2017 12:50 PM

## 2017-05-15 NOTE — Progress Notes (Signed)
[employer] [facility] - [place] at [facility]   PATIENT NAME: Goldie Dimmer    MR#:  [id number]  DATE OF BIRTH:  1955-10-31  SUBJECTIVE:   She presents with lower extremity swelling  REVIEW OF SYSTEMS:    Review of Systems  Constitutional: Negative for fever, chills weight loss HENT: Negative for ear pain, nosebleeds, congestion, facial swelling, rhinorrhea, neck pain, neck stiffness and ear discharge.   Respiratory: Negative for cough, shortness of breath, wheezing  Cardiovascular: Negative for chest pain, palpitations and ++ leg swelling.  Gastrointestinal: Negative for heartburn, abdominal pain, vomiting, diarrhea or consitpation Genitourinary: Negative for dysuria, urgency, frequency, hematuria Musculoskeletal: Negative for back pain or joint pain Neurological: Negative for dizziness, seizures, syncope, focal weakness,  numbness and headaches.  Hematological: Does not bruise/bleed easily.  Psychiatric/Behavioral: Negative for hallucinations, confusion, dysphoric mood    Tolerating Diet: yes      DRUG ALLERGIES:  No Known Allergies  VITALS:  Blood pressure 140/85, pulse 80, temperature 97.8 F (36.6 C), temperature source Oral, resp. rate 20, height 6\' 1"  (1.854 m), weight 73 kg (160 lb 15 oz), SpO2 100 %.  PHYSICAL EXAMINATION:  Constitutional: Appears well-developed and well-nourished. No distress. HENT: Normocephalic. [name] Oropharynx is clear and moist.  Eyes: Conjunctivae and EOM are normal. PERRLA, no scleral icterus.  Neck: Normal ROM. Neck supple. No JVD. No tracheal deviation. CVS: RRR, S1/S2 +, no murmurs, no gallops, no carotid bruit.  Pulmonary: Effort with exp wheezing mild NO rales.  Abdominal: Soft. BS +,  no distension, tenderness, rebound or guarding.  Musculoskeletal: Normal range of motion. ++LE R>L Neuro: Alert. CN 2-12 grossly intact. No focal deficits. Skin: Skin is warm and dry. No rash noted. Psychiatric: Normal mood and affect.       LABORATORY PANEL:   CBC Recent Labs  Lab 05/15/17 0409  WBC 5.0  HGB 14.4  HCT 43.2  PLT 304   ------------------------------------------------------------------------------------------------------------------  Chemistries  Recent Labs  Lab 05/15/17 0409  NA 137  K 3.4*  CL 103  CO2 28  GLUCOSE 94  BUN 7  CREATININE 0.85  CALCIUM 8.6*   ------------------------------------------------------------------------------------------------------------------  Cardiac Enzymes No results for input(s): TROPONINI in the last 168 hours. ------------------------------------------------------------------------------------------------------------------  RADIOLOGY:  US Venous Img Lower Unilateral Right  Result Date: 05/14/2017 CLINICAL DATA:  62 year old male with right lower extremity swelling for the past 3 days. Prior history left lower extremity DVT EXAM: RIGHT LOWER EXTREMITY VENOUS DOPPLER ULTRASOUND TECHNIQUE: Gray-scale sonography with graded compression, as well as color Doppler and duplex ultrasound were performed to evaluate the lower extremity deep venous systems from the level of the common femoral vein and including the common femoral, femoral, profunda femoral, popliteal and calf veins including the posterior tibial, peroneal and gastrocnemius veins when visible. The superficial great saphenous vein was also interrogated. Spectral Doppler was utilized to evaluate flow at rest and with distal augmentation maneuvers in the common femoral, femoral and popliteal veins. COMPARISON:  None. FINDINGS: Contralateral Common Femoral Vein: Abnormal contralateral left common femoral vein. The vessel is partially compressible. There is internal echogenic material but fairly significant flow on color Doppler imaging. Common Femoral Vein: Abnormal. The vessel is not compressible. There is nonocclusive echogenic material within the common femoral vein consistent with acute thrombus.  Saphenofemoral Junction: Extension of thrombus into the saphenofemoral junction. The remainder the great saphenous vein is patent. Profunda Femoral Vein: Extension of occlusive thrombus into the profunda femoral vein. Femoral Vein: Extension of occlusive thrombus into the femoral  vein extending throughout the thigh. No evidence of color flow on Doppler imaging. Popliteal Vein: Extension of occlusive thrombus into the popliteal vein. Calf Veins: Occlusive thrombus extends into the calf veins in the proximal calf. Superficial Great Saphenous Vein: No evidence of thrombus. Normal compressibility. Venous Reflux:  None. Other Findings:  None. IMPRESSION: 1. Positive for large volume right lower extremity acute DVT which is nonocclusive in the common femoral vein but occlusive in the femoral, popliteal and calf veins. 2. Also positive for acute versus chronic nonocclusive thrombus in the contralateral left common femoral vein. Electronically Signed   By: Malachy MoanHeath  McCullough M.D.   On: 05/14/2017 17:24     ASSESSMENT AND PLAN:   62 year old male with history of DVT in the past and COPD who presents to the emergency room due to right lower extremity swelling.  1. Large volume right lower extremity acute DVT which is nonocclusive in the common femoral vein but occlusive in the femoral, popliteal and calf veins: Case discussed this morning with Dr.Dew, given extent DVT, ongoing pain and lower extremity edema plan for thrombectomy this afternoon. Patient is in agreement N.p.o. Continue heparin drip for now and plan to switch to oral Eliquis tomorrow HYpercoag workup ordered and will need referral to ONCOLOGY after discharge/   2. Tobacco dependence: Patient is encouraged to quit smoking. Counseling was provided for 4 minutes.  3.  Essential hypertension: Continue isosorbide, atenolol and Norvasc   4.  CAD: Continue isosorbide, atenolol, aspirin, Lipitor  5.  COPD without signs exacerbation:  6 mild  hypokalemia: Replete and recheck in a.m.   Management plans discussed with the patient and he is in agreement.  CODE STATUS: full  TOTAL TIME TAKING CARE OF THIS PATIENT: 30 minutes.     POSSIBLE D/C tomorrow, DEPENDING ON CLINICAL CONDITION.   Langley Ingalls M.D on 05/15/2017 at 8:34 AM  Between 7am to 6pm - Pager - [phone] After 6pm go to [email or web address] - password [email or web address]  [facility]  Office  [phone]  CC: Primary care physician; Patient, No Pcp Per  Note: This dictation was prepared with Dragon dictation along with smaller phrase technology. Any transcriptional errors that result from this process are unintentional.

## 2017-05-16 LAB — HEPARIN LEVEL (UNFRACTIONATED): HEPARIN UNFRACTIONATED: 0.44 [IU]/mL (ref 0.30–0.70)

## 2017-05-16 LAB — CBC
HEMATOCRIT: 40.3 % (ref 40.0–52.0)
Hemoglobin: 13.5 g/dL (ref 13.0–18.0)
MCH: 30.1 pg (ref 26.0–34.0)
MCHC: 33.6 g/dL (ref 32.0–36.0)
MCV: 89.5 fL (ref 80.0–100.0)
PLATELETS: 233 10*3/uL (ref 150–440)
RBC: 4.5 MIL/uL (ref 4.40–5.90)
RDW: 15 % — AB (ref 11.5–14.5)
WBC: 5.2 10*3/uL (ref 3.8–10.6)

## 2017-05-16 LAB — BASIC METABOLIC PANEL
ANION GAP: 4 — AB (ref 5–15)
BUN: 8 mg/dL (ref 6–20)
CALCIUM: 8.2 mg/dL — AB (ref 8.9–10.3)
CO2: 28 mmol/L (ref 22–32)
Chloride: 104 mmol/L (ref 101–111)
Creatinine, Ser: 0.85 mg/dL (ref 0.61–1.24)
GFR calc Af Amer: >60 mL/min (ref >60)
Glucose, Bld: 121 mg/dL — ABNORMAL HIGH (ref 65–99)
Potassium: 4.2 mmol/L (ref 3.5–5.1)
Sodium: 136 mmol/L (ref 135–145)

## 2017-05-16 LAB — HIV ANTIBODY (ROUTINE TESTING W REFLEX): HIV Screen 4th Generation wRfx: NONREACTIVE

## 2017-05-16 MED ORDER — APIXABAN 5 MG PO TABS: 10.0000 mg | ORAL_TABLET | Freq: Two times a day (BID) | ORAL | 0 refills | Status: AC

## 2017-05-16 MED ORDER — APIXABAN 5 MG PO TABS: 5.0000 mg | ORAL_TABLET | Freq: Two times a day (BID) | ORAL | 0 refills | Status: AC

## 2017-05-16 MED ORDER — APIXABAN 5 MG PO TABS: 5.0000 mg | ORAL_TABLET | Freq: Two times a day (BID) | ORAL | Status: DC

## 2017-05-16 MED ORDER — HYDROCODONE-ACETAMINOPHEN 10-325 MG PO TABS
1.0000 | ORAL_TABLET | Freq: Four times a day (QID) | ORAL | 0 refills | Status: AC | PRN
Start: 2017-05-16 — End: ?

## 2017-05-16 MED ORDER — HEPARIN BOLUS VIA INFUSION
1100.0000 [IU] | Freq: Once | INTRAVENOUS | Status: AC
  Administered 2017-05-16: 1100 [IU] via INTRAVENOUS
  Filled 2017-05-16: qty 1100

## 2017-05-16 MED ORDER — APIXABAN 5 MG PO TABS
10.0000 mg | ORAL_TABLET | Freq: Two times a day (BID) | ORAL | Status: DC
  Administered 2017-05-16: 10 mg via ORAL
  Filled 2017-05-16: qty 2

## 2017-05-16 NOTE — Discharge Summary (Signed)
[facility] - [place] [facility]   PATIENT NAME: Bradley Dawson    MR#:  [id number]  DATE OF BIRTH:  07-06-1955  DATE OF ADMISSION:  05/14/2017 ADMITTING PHYSICIAN: Ihor Austin, MD  DATE OF DISCHARGE: 05/16/2017  PRIMARY CARE PHYSICIAN: Patient, No Pcp Per    ADMISSION DIAGNOSIS:  Acute deep vein thrombosis (DVT) of proximal vein of right lower extremity (HCC) [I82.4Y1]  DISCHARGE DIAGNOSIS:  Active Problems:   DVT (deep vein thrombosis) in pregnancy (HCC)   SECONDARY DIAGNOSIS:   Past Medical History:  Diagnosis Date  . COPD (chronic obstructive pulmonary disease) (HCC)   . Coronary artery disease   . DVT (deep venous thrombosis) (HCC)   . Hypertension     HOSPITAL COURSE:  62 year old male with history of DVT in the past and COPD who presents to the emergency room due to right lower extremity swelling.  1. Large volume right lower extremity acute DVT which is nonocclusive in the common femoral vein but occlusive in the femoral, popliteal and calf veins: Vascular surgery was consulted for thrombolyzes.  Patient underwent thrombolyzes to the right superficial femoral vein, common femoral vein, iliac vein and inferior vena cava.  He has an IVC filter placed. He will follow-up with vascular surgery in 1 week. He has tolerated Eliquis in the past and will continue with this. He will have outpatient follow-up with oncology. HYpercoag workup was ordered and this can be followed up by the oncology service.  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  Patient reports he will buy compression stockings at the [facility].   2. Tobacco dependence: Patient is encouraged to quit smoking. Counseling was provided for 4 minutes.  3.  Essential hypertension: Continue isosorbide, atenolol and Norvasc   4.  CAD: Continue isosorbide,  atenolol, aspirin, Lipitor  5.  COPD without signs exacerbation:  6 mild hypokalemia: Repleted    DISCHARGE CONDITIONS AND DIET:   Stable for discharge on regular diet  CONSULTS OBTAINED:  Treatment Team:  Annice Needy, MD  DRUG ALLERGIES:  No Known Allergies  DISCHARGE MEDICATIONS:   Allergies as of 05/16/2017   No Known Allergies     Medication List    STOP taking these medications   aspirin EC 81 MG tablet     TAKE these medications   albuterol 108 (90 Base) MCG/ACT inhaler Commonly known as:  PROVENTIL HFA;VENTOLIN HFA Inhale 2 puffs into the lungs every 6 (six) hours as needed for wheezing.   ANORO ELLIPTA 62.5-25 MCG/INH Aepb Generic drug:  umeclidinium-vilanterol Inhale 1 puff into the lungs daily.   apixaban 5 MG Tabs tablet Commonly known as:  ELIQUIS Take 2 tablets (10 mg total) by mouth 2 (two) times daily for 2 days. What changed:  how much to take   apixaban 5 MG Tabs tablet Commonly known as:  ELIQUIS Take 1 tablet (5 mg total) by mouth 2 (two) times daily. Start 05/19/2017 Start taking on:  05/19/2017 What changed:  You were already taking a medication with the same name, and this prescription was added. Make sure you understand how and when to take each.   atenolol 25 MG tablet Commonly known as:  TENORMIN Take 25 mg by mouth daily.   atorvastatin 40 MG tablet Commonly known as:  LIPITOR Take 40 mg by mouth daily with supper.   COMBIVENT IN Inhale 2 puffs into the lungs every  6 (six) hours as needed (Wheezing).   ipratropium-albuterol 0.5-2.5 (3) MG/3ML Soln Commonly known as:  DUONEB Take 3 mLs by nebulization every 6 (six) hours as needed.   gabapentin 300 MG capsule Commonly known as:  NEURONTIN Take 300 mg by mouth 3 (three) times daily.   HYDROcodone-acetaminophen 10-325 MG tablet Commonly known as:  NORCO Take 1 tablet by mouth every 6 (six) hours as needed.   isosorbide mononitrate 60 MG 24 hr tablet Commonly known as:   IMDUR Take 60 mg by mouth daily.   levocetirizine 5 MG tablet Commonly known as:  XYZAL Take 5 mg by mouth every evening.   RANEXA 1000 MG SR tablet Generic drug:  ranolazine Take 500 mg by mouth 2 (two) times daily.   sertraline 50 MG tablet Commonly known as:  ZOLOFT Take 50 mg by mouth daily.   traZODone 50 MG tablet Commonly known as:  DESYREL Take 50 mg by mouth at bedtime as needed for sleep.         Today   CHIEF COMPLAINT:   Patient doing well this morning.  Reports some pain in the right lower extremity   VITAL SIGNS:  Blood pressure 132/83, pulse 91, temperature 98.2 F (36.8 C), temperature source Oral, resp. rate 16, height  (1.854 m), weight 72.6 kg (160 lb), SpO2 97 %.   REVIEW OF SYSTEMS:  Review of Systems  Constitutional: Negative.  Negative for chills, fever and malaise/fatigue.  HENT: Negative.  Negative for ear discharge, ear pain, hearing loss, nosebleeds and sore throat.   Eyes: Negative.  Negative for blurred vision and pain.  Respiratory: Negative.  Negative for cough, hemoptysis, shortness of breath and wheezing.   Cardiovascular: Positive for leg swelling. Negative for chest pain and palpitations.  Gastrointestinal: Negative.  Negative for abdominal pain, blood in stool, diarrhea, nausea and vomiting.  Genitourinary: Negative.  Negative for dysuria.  Musculoskeletal: Negative.  Negative for back pain.  Skin: Negative.   Neurological: Negative for dizziness, tremors, speech change, focal weakness, seizures and headaches.  Endo/Heme/Allergies: Negative.  Does not bruise/bleed easily.  Psychiatric/Behavioral: Negative.  Negative for depression, hallucinations and suicidal ideas.     PHYSICAL EXAMINATION:  GENERAL:  62 y.o.-year-old patient lying in the bed with no acute distress.  NECK:  Supple, no jugular venous distention. No thyroid enlargement, no tenderness.  LUNGS: Normal breath sounds bilaterally, no wheezing, rales,rhonchi   No use of accessory muscles of respiration.  CARDIOVASCULAR: S1, S2 normal. No murmurs, rubs, or gallops.  ABDOMEN: Soft, non-tender, non-distended. Bowel sounds present. No organomegaly or mass.  EXTREMITIES: Right leg wrapped in left leg with lower extremity edema which is chronic  pSYCHIATRIC: The patient is alert and oriented x 3.  SKIN: No obvious rash, lesion, or ulcer.   DATA REVIEW:   CBC Recent Labs  Lab 05/16/17 0458  WBC 5.2  HGB 13.5  HCT 40.3  PLT 233    Chemistries  Recent Labs  Lab 05/16/17 0458  NA 136  K 4.2  CL 104  CO2 28  GLUCOSE 121*  BUN 8  CREATININE 0.85  CALCIUM 8.2*    Cardiac Enzymes No results for input(s): TROPONINI in the last 168 hours.  Microbiology Results  @  RADIOLOGY:  US Venous Img Lower Unilateral Right  Result Date: 05/14/2017 CLINICAL DATA:  62 year old male with right lower extremity swelling for the past 3 days. Prior history left lower extremity DVT EXAM: RIGHT LOWER EXTREMITY VENOUS DOPPLER ULTRASOUND TECHNIQUE: Gray-scale sonography  with graded compression, as well as color Doppler and duplex ultrasound were performed to evaluate the lower extremity deep venous systems from the level of the common femoral vein and including the common femoral, femoral, profunda femoral, popliteal and calf veins including the posterior tibial, peroneal and gastrocnemius veins when visible. The superficial great saphenous vein was also interrogated. Spectral Doppler was utilized to evaluate flow at rest and with distal augmentation maneuvers in the common femoral, femoral and popliteal veins. COMPARISON:  None. FINDINGS: Contralateral Common Femoral Vein: Abnormal contralateral left common femoral vein. The vessel is partially compressible. There is internal echogenic material but fairly significant flow on color Doppler imaging. Common Femoral Vein: Abnormal. The vessel is not compressible. There is nonocclusive echogenic material  within the common femoral vein consistent with acute thrombus. Saphenofemoral Junction: Extension of thrombus into the saphenofemoral junction. The remainder the great saphenous vein is patent. Profunda Femoral Vein: Extension of occlusive thrombus into the profunda femoral vein. Femoral Vein: Extension of occlusive thrombus into the femoral vein extending throughout the thigh. No evidence of color flow on Doppler imaging. Popliteal Vein: Extension of occlusive thrombus into the popliteal vein. Calf Veins: Occlusive thrombus extends into the calf veins in the proximal calf. Superficial Great Saphenous Vein: No evidence of thrombus. Normal compressibility. Venous Reflux:  None. Other Findings:  None. IMPRESSION: 1. Positive for large volume right lower extremity acute DVT which is nonocclusive in the common femoral vein but occlusive in the femoral, popliteal and calf veins. 2. Also positive for acute versus chronic nonocclusive thrombus in the contralateral left common femoral vein. Electronically Signed   By: Malachy Moan M.D.   On: 05/14/2017 17:24      Allergies as of 05/16/2017   No Known Allergies     Medication List    STOP taking these medications   aspirin EC 81 MG tablet     TAKE these medications   albuterol 108 (90 Base) MCG/ACT inhaler Commonly known as:  PROVENTIL HFA;VENTOLIN HFA Inhale 2 puffs into the lungs every 6 (six) hours as needed for wheezing.   ANORO ELLIPTA 62.5-25 MCG/INH Aepb Generic drug:  umeclidinium-vilanterol Inhale 1 puff into the lungs daily.   apixaban 5 MG Tabs tablet Commonly known as:  ELIQUIS Take 2 tablets (10 mg total) by mouth 2 (two) times daily for 2 days. What changed:  how much to take   apixaban 5 MG Tabs tablet Commonly known as:  ELIQUIS Take 1 tablet (5 mg total) by mouth 2 (two) times daily. Start 05/19/2017 Start taking on:  05/19/2017 What changed:  You were already taking a medication with the same name, and this prescription  was added. Make sure you understand how and when to take each.   atenolol 25 MG tablet Commonly known as:  TENORMIN Take 25 mg by mouth daily.   atorvastatin 40 MG tablet Commonly known as:  LIPITOR Take 40 mg by mouth daily with supper.   COMBIVENT IN Inhale 2 puffs into the lungs every 6 (six) hours as needed (Wheezing).   ipratropium-albuterol 0.5-2.5 (3) MG/3ML Soln Commonly known as:  DUONEB Take 3 mLs by nebulization every 6 (six) hours as needed.   gabapentin 300 MG capsule Commonly known as:  NEURONTIN Take 300 mg by mouth 3 (three) times daily.   HYDROcodone-acetaminophen 10-325 MG tablet Commonly known as:  NORCO Take 1 tablet by mouth every 6 (six) hours as needed.   isosorbide mononitrate 60 MG 24 hr tablet Commonly known as:  IMDUR Take 60 mg by mouth daily.   levocetirizine 5 MG tablet Commonly known as:  XYZAL Take 5 mg by mouth every evening.   RANEXA 1000 MG SR tablet Generic drug:  ranolazine Take 500 mg by mouth 2 (two) times daily.   sertraline 50 MG tablet Commonly known as:  ZOLOFT Take 50 mg by mouth daily.   traZODone 50 MG tablet Commonly known as:  DESYREL Take 50 mg by mouth at bedtime as needed for sleep.          Management plans discussed with the patient and he is in agreement. Stable for discharge home  Patient should follow up with dr dew  CODE STATUS:     Code Status Orders  (From admission, onward)        Start     Ordered   05/14/17 2204  Full code  Continuous     05/14/17 2204    Code Status History    This patient has a current code status but no historical code status.      TOTAL TIME TAKING CARE OF THIS PATIENT: 38 minutes.    Note: This dictation was prepared with Dragon dictation along with smaller phrase technology. Any transcriptional errors that result from this process are unintentional.  Margan Elias M.D on 05/16/2017 at 8:38 AM  Between 7am to 6pm - Pager - [phone] After 6pm go to  [email or web address] - password [email or web address]  [facility]  Office  [phone]  CC: Primary care physician; Patient, No Pcp Per

## 2017-05-16 NOTE — Progress Notes (Signed)
ANTIBIOTIC CONSULT NOTE  Pharmacy Consult for Apixaban Dosing  Indication: DVT  No Known Allergies  Patient Measurements: Height:  (185.4 cm) Weight: 160 lb (72.6 kg) IBW/kg (Calculated) : 79.9   Vital Signs: Temp: 98.2 F (36.8 C) (04/27 0549) Temp Source: Oral (04/27 0549) BP: 132/83 (04/27 0549) Pulse Rate: 91 (04/27 0549) Intake/Output from previous day: 04/26 0701 - 04/27 0700 In: 2033.1 [P.O.:240; I.V.:1793.1] Out: 525 [Urine:525] Intake/Output from this shift: No intake/output data recorded.  Labs: Recent Labs    05/14/17 1944 05/15/17 0409 05/16/17 0458  WBC 6.6 5.0 5.2  HGB 15.6 14.4 13.5  PLT 301 304 233  CREATININE 0.75 0.85 0.85   Estimated Creatinine Clearance: 93.7 mL/min (by C-G formula based on SCr of 0.85 mg/dL). No results for input(s): VANCOTROUGH, VANCOPEAK, VANCORANDOM, GENTTROUGH, GENTPEAK, GENTRANDOM, TOBRATROUGH, TOBRAPEAK, TOBRARND, AMIKACINPEAK, AMIKACINTROU, AMIKACIN in the last 72 hours.   Microbiology: Recent Results (from the past 720 hour(s))  Surgical PCR screen     Status: None   Collection Time: 05/15/17  7:48 AM  Result Value Ref Range Status   MRSA, PCR NEGATIVE NEGATIVE Final   Staphylococcus aureus NEGATIVE NEGATIVE Final    Comment: (NOTE) The Xpert SA Assay (FDA approved for NASAL specimens in patients 70 years of age and older), is one component of a comprehensive surveillance program. It is not intended to diagnose infection nor to guide or monitor treatment. Performed at [facility], [address] [place], [place] [address]     Medical History: Past Medical History:  Diagnosis Date  . COPD (chronic obstructive pulmonary disease) (HCC)   . Coronary artery disease   . DVT (deep venous thrombosis) (HCC)   . Hypertension     Medications:  Scheduled:  . amLODipine  5 mg Oral Daily  . apixaban  10 mg Oral BID   Followed by  . [START ON 05/19/2017] apixaban  5 mg Oral BID  . aspirin EC  81 mg  Oral Daily  . atenolol  25 mg Oral Daily  . atorvastatin  40 mg Oral Q supper  . docusate sodium  100 mg Oral BID  . gabapentin  300 mg Oral TID  . isosorbide mononitrate  60 mg Oral Daily  . loratadine  10 mg Oral QPM  . nicotine  21 mg Transdermal Daily  . ranolazine  500 mg Oral BID  . sertraline  50 mg Oral Daily  . sodium chloride flush  3 mL Intravenous Q12H  . umeclidinium-vilanterol  1 puff Inhalation Daily   Assessment: Pharmacy consulted for apixaban dosing for 62 yo male admitted with DVT. Patient with vascular procedure on 4/26. Heparin drip currently infusing at 1450 units/hr.   Plan:  Will initiate apixaban  PO BID x 7 days followed by apixaban  PO BID. Spoke with RN and instructed to stop heparin drip when first dose of apixaban is administered. Will obtain CBC with am labs and per policy thereafter.   Pharmacy will continue to monitor and adjust per consult.   Kaianna Dolezal L 05/16/2017,8:20 AM

## 2017-05-16 NOTE — Care Management Note (Signed)
Case Management Note  Patient Details  Name: Bradley Dawson MRN: [id number] Date of Birth: [birth year]  Subjective/Objective:   Met with patient at bedside to discuss discharge planning. Patient has no PCP. He lives with his sister and his son at the moment. He receives disability of $512 per month. He has no other income. [facility].Patient states he can get his sister to but his medications until Monday.Referral sent to both agencies.                    Action/Plan:   Expected Discharge Date:  05/16/17               Expected Discharge Plan:  Home/Self Care  In-House Referral:     Discharge planning Services  CM Consult, [facility], Medication Assistance  Post Acute Care Choice:    Choice offered to:  Patient  DME Arranged:    DME Agency:     HH Arranged:    [place] Agency:     Status of Service:  Completed, signed off  If discussed at [employer], dates discussed:    Additional Comments:  Jolly Mango, RN 05/16/2017, 12:58 PM

## 2017-05-16 NOTE — Progress Notes (Signed)
ANTICOAGULATION CONSULT NOTE - Initial Consult  Pharmacy Consult for heparin gtt Indication: DVT  No Known Allergies  Patient Measurements: Height:  (185.4 cm) Weight: 160 lb (72.6 kg) IBW/kg (Calculated) : 79.9 Heparin Dosing Weight: 61.2kg  Vital Signs: Temp: 98.3 F (36.8 C) (04/26 1933) Temp Source: Oral (04/26 1933) BP: 137/74 (04/26 1933) Pulse Rate: 91 (04/26 1933)  Labs: Recent Labs    05/14/17 1944 05/14/17 2037  05/15/17 0409 05/15/17 1101 05/15/17 1900 05/15/17 2214  HGB 15.6  --   --  14.4  --   --   --   HCT 47.5  --   --  43.2  --   --   --   PLT 301  --   --  304  --   --   --   APTT  --  30  --   --   --   --   --   LABPROT  --  13.2  --   --   --   --   --   INR  --  1.01  --   --   --   --   --   HEPARINUNFRC  --   --    < > 0.27* 0.16* 0.18* 0.23*  CREATININE 0.75  --   --  0.85  --   --   --    < > = values in this interval not displayed.    Estimated Creatinine Clearance: 93.7 mL/min (by C-G formula based on SCr of 0.85 mg/dL).   Medical History: Past Medical History:  Diagnosis Date  . COPD (chronic obstructive pulmonary disease) (HCC)   . Coronary artery disease   . DVT (deep venous thrombosis) (HCC)   . Hypertension     Medications:  Medications Prior to Admission  Medication Sig Dispense Refill Last Dose  . albuterol (PROVENTIL HFA;VENTOLIN HFA) 108 (90 Base) MCG/ACT inhaler Inhale 2 puffs into the lungs every 6 (six) hours as needed for wheezing.   PRN at PRN  . apixaban (ELIQUIS) 5 MG TABS tablet Take 5 mg by mouth 2 (two) times daily.   Past Month at Unknown time  . aspirin EC 81 MG tablet Take 81 mg by mouth daily.   Past Month at Unknown time  . atenolol (TENORMIN) 25 MG tablet Take 25 mg by mouth daily.   Past Month at Unknown time  . atorvastatin (LIPITOR) 40 MG tablet Take 40 mg by mouth daily with supper.   Past Month at Unknown time  . gabapentin (NEURONTIN) 300 MG capsule Take 300 mg by mouth 3 (three) times daily.    Past Month at Unknown time  . HYDROcodone-acetaminophen (NORCO) 10-325 MG tablet Take 1 tablet by mouth every 6 (six) hours as needed. 12 tablet 0   . Ipratropium-Albuterol (COMBIVENT IN) Inhale 2 puffs into the lungs every 6 (six) hours as needed (Wheezing).   PRN at PRN  . ipratropium-albuterol (DUONEB) 0.5-2.5 (3) MG/3ML SOLN Take 3 mLs by nebulization every 6 (six) hours as needed.   PRN at PRN  . isosorbide mononitrate (IMDUR) 60 MG 24 hr tablet Take 60 mg by mouth daily.   Past Month at Unknown time  . levocetirizine (XYZAL) 5 MG tablet Take 5 mg by mouth every evening.   Past Month at Unknown time  . ranolazine (RANEXA) 1000 MG SR tablet Take 500 mg by mouth 2 (two) times daily.   Past Month at Unknown time  . sertraline (ZOLOFT)  50 MG tablet Take 50 mg by mouth daily.   Past Month at Unknown time  . traZODone (DESYREL) 50 MG tablet Take 50 mg by mouth at bedtime as needed for sleep.   PRN at PRN  . umeclidinium-vilanterol (ANORO ELLIPTA) 62.5-25 MCG/INH AEPB Inhale 1 puff into the lungs daily.   Past Month at Unknown time   Scheduled:  . amLODipine  5 mg Oral Daily  . aspirin EC  81 mg Oral Daily  . atenolol  25 mg Oral Daily  . atorvastatin  40 mg Oral Q supper  . docusate sodium  100 mg Oral BID  . gabapentin  300 mg Oral TID  . heparin  1,100 Units Intravenous Once  . isosorbide mononitrate  60 mg Oral Daily  . loratadine  10 mg Oral QPM  . nicotine  21 mg Transdermal Daily  . ranolazine  500 mg Oral BID  . sertraline  50 mg Oral Daily  . sodium chloride flush  3 mL Intravenous Q12H  . umeclidinium-vilanterol  1 puff Inhalation Daily   Infusions:  . sodium chloride    . sodium chloride 75 mL/hr at 05/15/17 0856  . 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  HL below goal but infusion paused.   Goal of Therapy:  Heparin level 0.3-0.7 units/ml Monitor platelets by anticoagulation protocol: Yes   Plan:  Heparin infusion paused and resumed at 1630. Will repeat level at 2230.   04/26 @ 2200 HL 0.23 subtherapeutic, no interruptions in drip per RN. Will rebolus w/ heparin 1100 units IV x 1 and increase rate to 1450 units/hr and will recheck HL w/ am labs.  Thomasene Ripple, PharmD, BCPS Clinical Pharmacist 05/16/2017

## 2017-05-16 NOTE — Progress Notes (Signed)
ANTICOAGULATION CONSULT NOTE - Initial Consult  Pharmacy Consult for heparin gtt Indication: DVT  No Known Allergies  Patient Measurements: Height:  (185.4 cm) Weight: 160 lb (72.6 kg) IBW/kg (Calculated) : 79.9 Heparin Dosing Weight: 61.2kg  Vital Signs: Temp: 98.2 F (36.8 C) (04/27 0549) Temp Source: Oral (04/27 0549) BP: 132/83 (04/27 0549) Pulse Rate: 91 (04/27 0549)  Labs: Recent Labs    05/14/17 1944 05/14/17 2037 05/15/17 0409  05/15/17 1900 05/15/17 2214 05/16/17 0458  HGB 15.6  --  14.4  --   --   --  13.5  HCT 47.5  --  43.2  --   --   --  40.3  PLT 301  --  304  --   --   --  233  APTT  --  30  --   --   --   --   --   LABPROT  --  13.2  --   --   --   --   --   INR  --  1.01  --   --   --   --   --   HEPARINUNFRC  --   --  0.27*   < > 0.18* 0.23* 0.44  CREATININE 0.75  --  0.85  --   --   --  0.85   < > = values in this interval not displayed.    Estimated Creatinine Clearance: 93.7 mL/min (by C-G formula based on SCr of 0.85 mg/dL).   Medical History: Past Medical History:  Diagnosis Date  . COPD (chronic obstructive pulmonary disease) (HCC)   . Coronary artery disease   . DVT (deep venous thrombosis) (HCC)   . Hypertension     Medications:  Medications Prior to Admission  Medication Sig Dispense Refill Last Dose  . albuterol (PROVENTIL HFA;VENTOLIN HFA) 108 (90 Base) MCG/ACT inhaler Inhale 2 puffs into the lungs every 6 (six) hours as needed for wheezing.   PRN at PRN  . apixaban (ELIQUIS) 5 MG TABS tablet Take 5 mg by mouth 2 (two) times daily.   Past Month at Unknown time  . aspirin EC 81 MG tablet Take 81 mg by mouth daily.   Past Month at Unknown time  . atenolol (TENORMIN) 25 MG tablet Take 25 mg by mouth daily.   Past Month at Unknown time  . atorvastatin (LIPITOR) 40 MG tablet Take 40 mg by mouth daily with supper.   Past Month at Unknown time  . gabapentin (NEURONTIN) 300 MG capsule Take 300 mg by mouth 3 (three) times daily.    Past Month at Unknown time  . HYDROcodone-acetaminophen (NORCO) 10-325 MG tablet Take 1 tablet by mouth every 6 (six) hours as needed. 12 tablet 0   . Ipratropium-Albuterol (COMBIVENT IN) Inhale 2 puffs into the lungs every 6 (six) hours as needed (Wheezing).   PRN at PRN  . ipratropium-albuterol (DUONEB) 0.5-2.5 (3) MG/3ML SOLN Take 3 mLs by nebulization every 6 (six) hours as needed.   PRN at PRN  . isosorbide mononitrate (IMDUR) 60 MG 24 hr tablet Take 60 mg by mouth daily.   Past Month at Unknown time  . levocetirizine (XYZAL) 5 MG tablet Take 5 mg by mouth every evening.   Past Month at Unknown time  . ranolazine (RANEXA) 1000 MG SR tablet Take 500 mg by mouth 2 (two) times daily.   Past Month at Unknown time  . sertraline (ZOLOFT) 50 MG tablet Take 50 mg by mouth  daily.   Past Month at Unknown time  . traZODone (DESYREL) 50 MG tablet Take 50 mg by mouth at bedtime as needed for sleep.   PRN at PRN  . umeclidinium-vilanterol (ANORO ELLIPTA) 62.5-25 MCG/INH AEPB Inhale 1 puff into the lungs daily.   Past Month at Unknown time   Scheduled:  . amLODipine  5 mg Oral Daily  . aspirin EC  81 mg Oral Daily  . atenolol  25 mg Oral Daily  . atorvastatin  40 mg Oral Q supper  . docusate sodium  100 mg Oral BID  . gabapentin  300 mg Oral TID  . isosorbide mononitrate  60 mg Oral Daily  . loratadine  10 mg Oral QPM  . nicotine  21 mg Transdermal Daily  . ranolazine  500 mg Oral BID  . sertraline  50 mg Oral Daily  . sodium chloride flush  3 mL Intravenous Q12H  . umeclidinium-vilanterol  1 puff Inhalation Daily   Infusions:  . sodium chloride    . sodium chloride 75 mL/hr at 05/16/17 0354  . 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  HL below goal but infusion paused.   Goal of Therapy:  Heparin level 0.3-0.7 units/ml Monitor platelets by anticoagulation protocol: Yes   Plan:  Heparin infusion paused and resumed at 1630. Will repeat level at 2230.   04/26 @ 2200 HL 0.23 subtherapeutic, no interruptions in drip per RN. Will rebolus w/ heparin 1100 units IV x 1 and increase rate to 1450 units/hr and will recheck HL w/ am labs.  04/27 @ 0500 HL 0.44 therapeutic. Will continue current rate and will recheck @ 1100. H/h have gone down slightly, will continue to monitor.  Thomasene Ripple, PharmD, BCPS Clinical Pharmacist 05/16/2017

## 2017-05-18 ENCOUNTER — Encounter: Payer: Self-pay | Admitting: Vascular Surgery

## 2017-05-18 LAB — PROTEIN C ACTIVITY: Protein C Activity: 65 % — ABNORMAL LOW (ref 73–180)

## 2017-05-18 LAB — HOMOCYSTEINE: Homocysteine: 21.7 umol/L — ABNORMAL HIGH (ref 0.0–15.0)

## 2017-05-18 LAB — PROTEIN S, TOTAL: Protein S Ag, Total: 104 % (ref 60–150)

## 2017-05-18 LAB — LUPUS ANTICOAGULANT PANEL
DRVVT: 38.7 s (ref 0.0–47.0)
PTT Lupus Anticoagulant: 44.6 s (ref 0.0–51.9)

## 2017-05-18 LAB — PROTEIN C, TOTAL: PROTEIN C, TOTAL: 60 % (ref 60–150)

## 2017-05-18 LAB — PROTEIN S ACTIVITY: Protein S Activity: 80 % (ref 63–140)

## 2017-05-19 LAB — BETA-2-GLYCOPROTEIN I ABS, IGG/M/A
BETA-2-GLYCOPROTEIN I IGA: 72 GPI IgA units — AB (ref 0–25)
Beta-2 Glyco I IgG: <9 GPI IgG units (ref 0–20)

## 2017-05-19 LAB — CARDIOLIPIN ANTIBODIES, IGG, IGM, IGA
Anticardiolipin IgA: <9 APL U/mL (ref 0–11)
Anticardiolipin IgG: <9 GPL U/mL (ref 0–14)
Anticardiolipin IgM: 11 MPL U/mL (ref 0–12)

## 2017-05-22 LAB — PROTHROMBIN GENE MUTATION

## 2017-05-25 LAB — FACTOR 5 LEIDEN

## 2018-04-10 IMAGING — US US EXTREM LOW*R* LIMITED
1 series · 13 of 25 positions shown · non-contrast
Comparison: CT Abdomen and Pelvis 08/03/2008.

CLINICAL DATA: 60 year old male with right testicular pain and
right inguinal swelling for 1 day.

EXAM:
SCROTAL ULTRASOUND
DOPPLER ULTRASOUND OF THE TESTICLES
ULTRASOUND RIGHT LOWER EXTREMITY LIMITED
TECHNIQUE: Complete ultrasound examination of the testicles, epididymis, and
other scrotal structures was performed.
Color and spectral Doppler ultrasound were also utilized to evaluate
blood flow to the testicles.
Limited gray scale and Doppler ultrasound of the right inguinal
canal, area of clinical concern.

[Series 1: us extrem low*right* limited · 0.07mm/px · 13 of 89 slices shown]
[im 1/89]
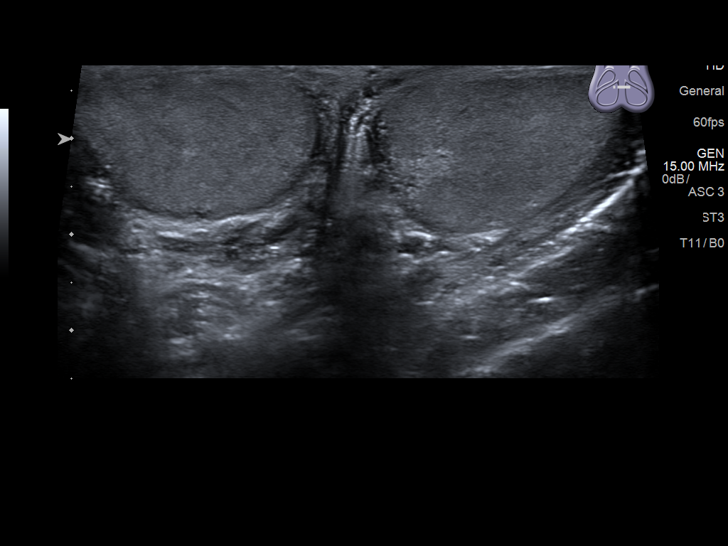
[im 8/89]
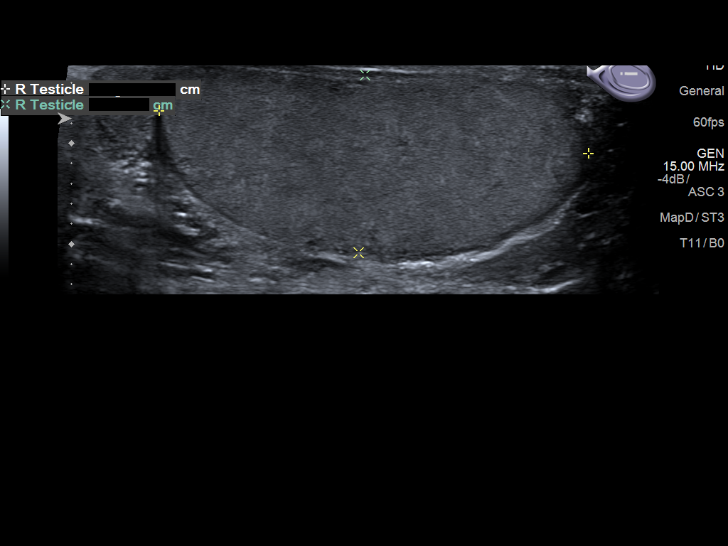
[im 15/89]
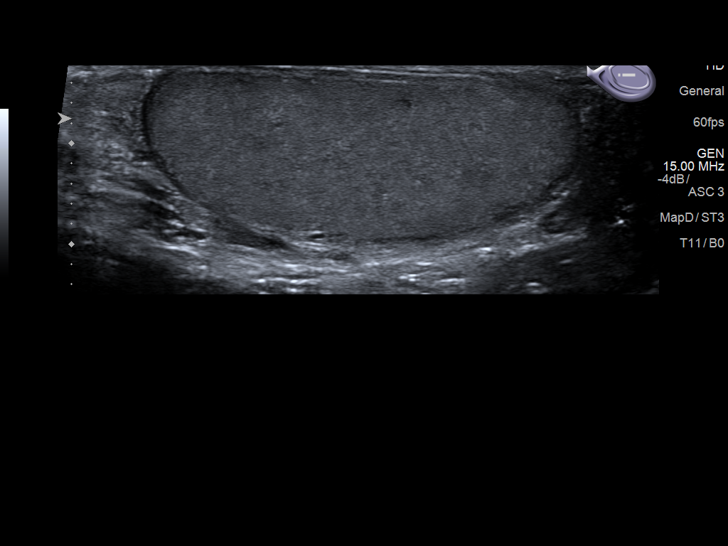
[im 23/89]
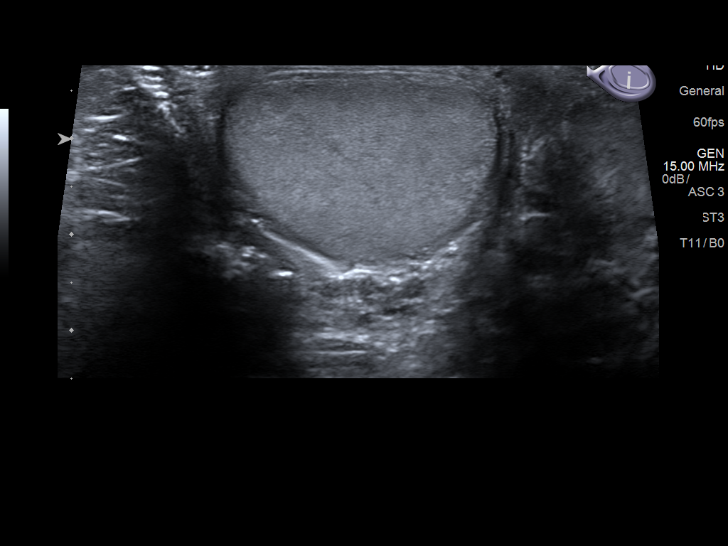
[im 30/89]
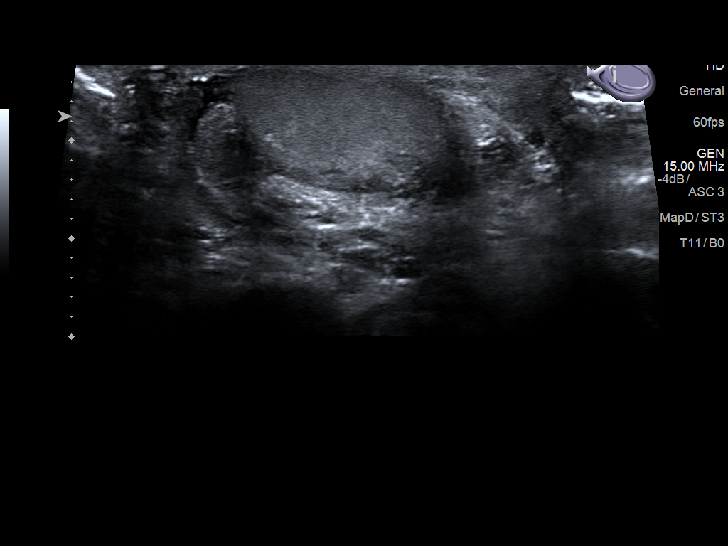
[im 37/89]
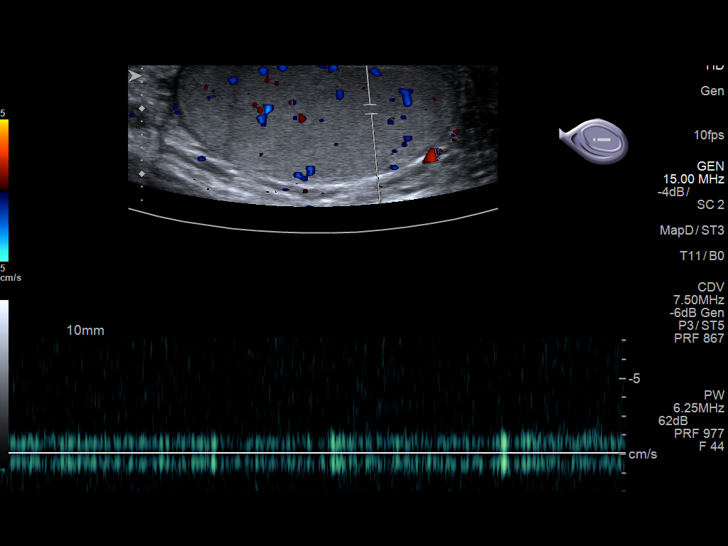
[im 45/89]
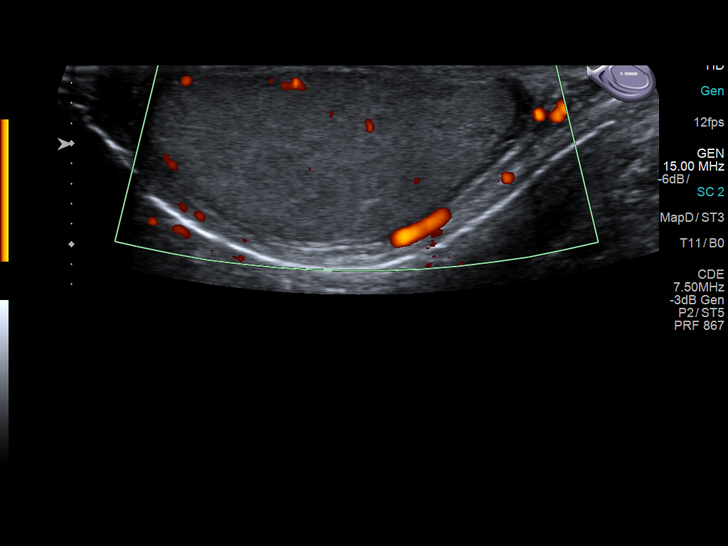
[im 52/89]
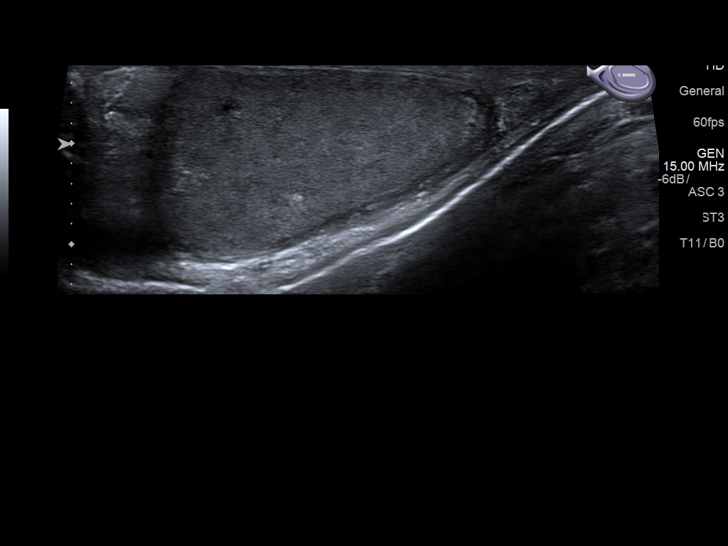
[im 59/89]
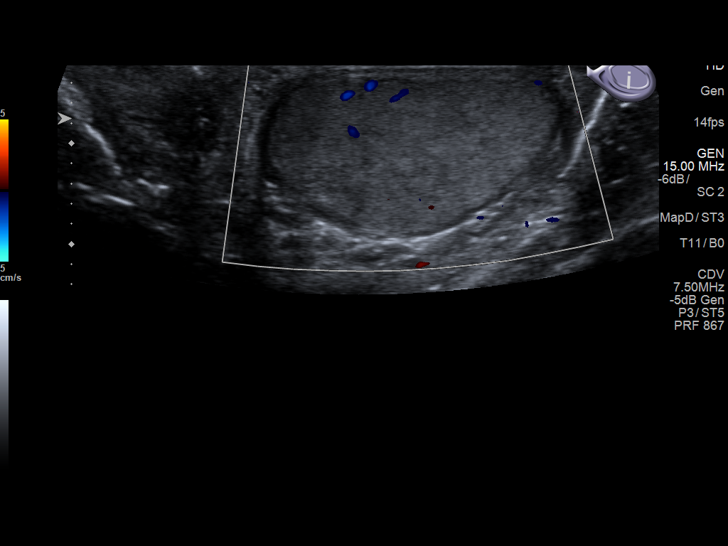
[im 67/89]
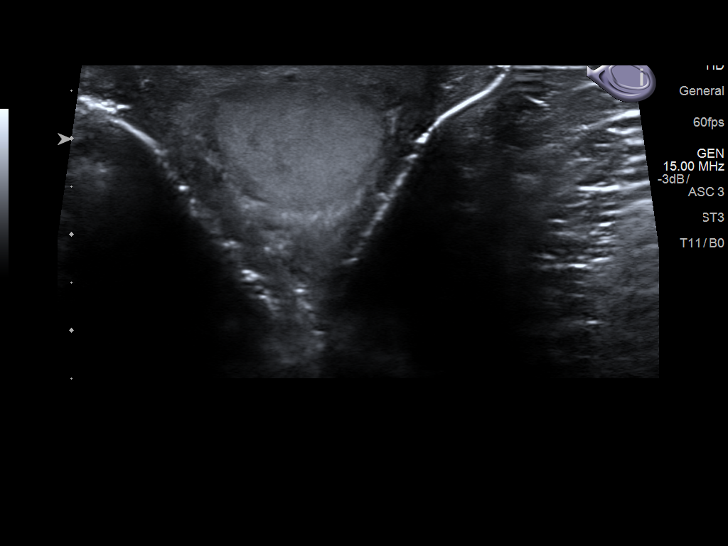
[im 74/89]
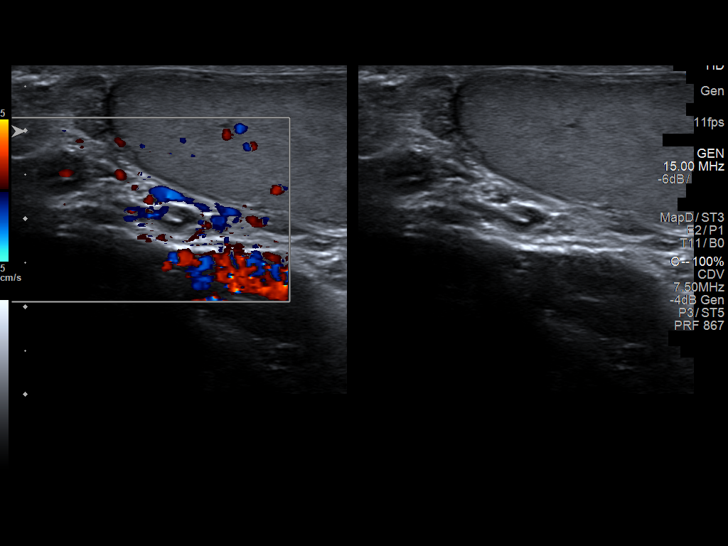
[im 81/89]
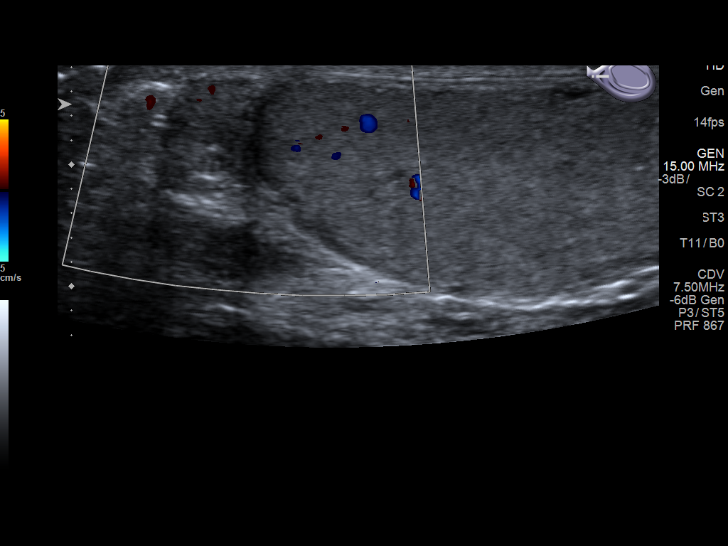
[im 89/89]
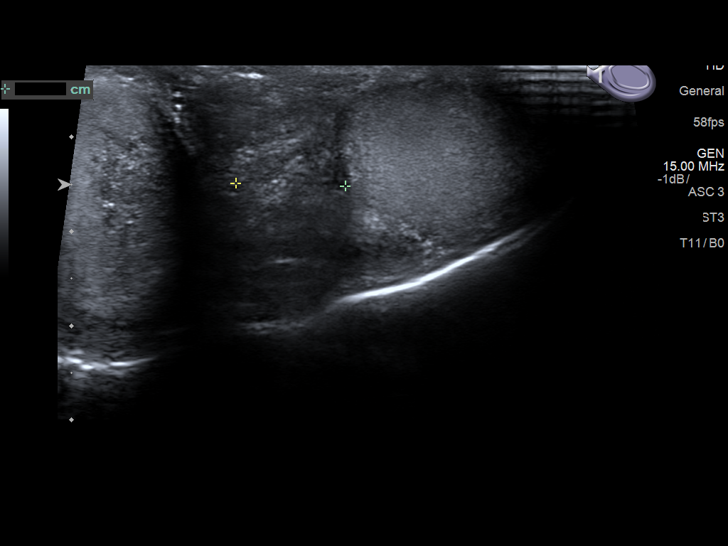

[13 of 25 positions shown; findings below may reference images not displayed]

FINDINGS: Right testicle

Measurements: 4.3 x 1.8 x 2.8 cm. No mass or microlithiasis
visualized.

Left testicle

Measurements: 4.3 x 1.7 x 2.9 cm. No mass or microlithiasis
visualized.

Right epididymis:  Normal in size and appearance.

Left epididymis:  Normal in size and appearance.

Hydrocele:  None visualized.

Varicocele:  None visualized.

Pulsed Doppler interrogation of both testes demonstrates normal low
resistance arterial and venous waveforms bilaterally.

Other findings: Ultrasound scanning of the right inguinal canal
demonstrates normal fibromuscular architecture. A normal appearing
lymph node with fatty hilum measuring 7 mm short axis is
incidentally noted. No mass or hernia identified.
IMPRESSION: 1. Negative for testicular torsion. Normal scrotal ultrasound with
Doppler.
2. Normal ultrasound appearance of the right inguinal canal.

## 2018-10-17 IMAGING — US US EXTREM LOW VENOUS*R*
1 series · 13 of 24 positions shown · non-contrast
Comparison: None.

CLINICAL DATA: 61-year-old male with right lower extremity swelling
for the past 3 days. Prior history left lower extremity DVT



[Series 1: us extrem low venous*right* · 0.06mm/px · 13 of 30 slices shown]
[im 1/30]
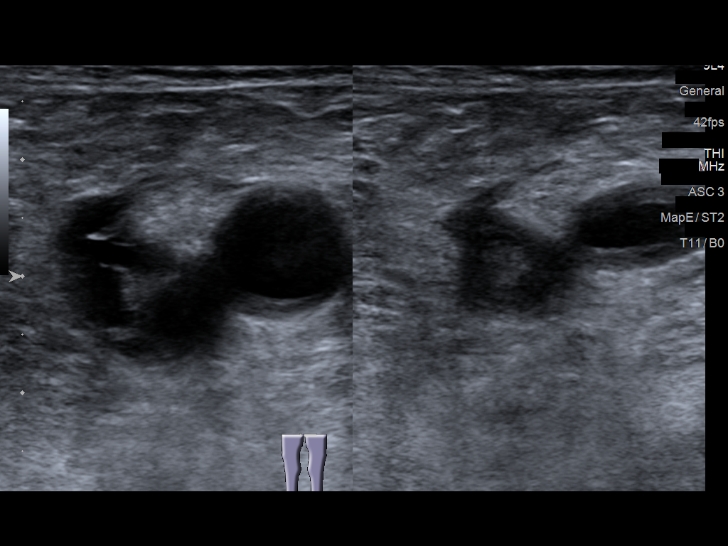
[im 3/30]
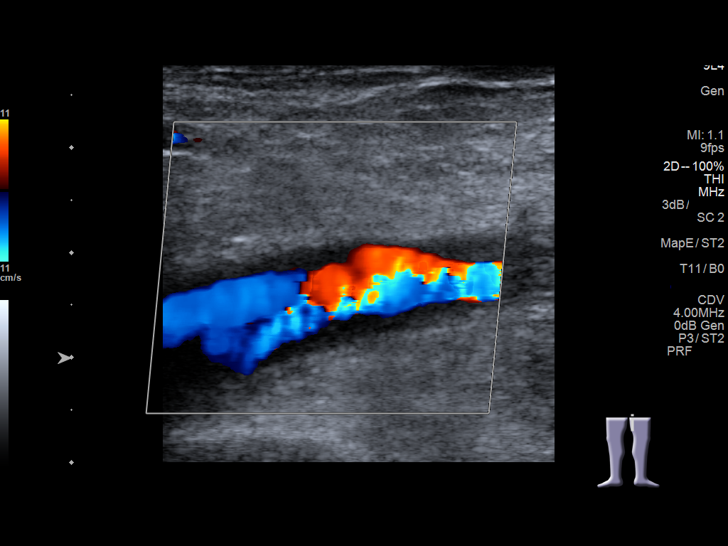
[im 6/30]
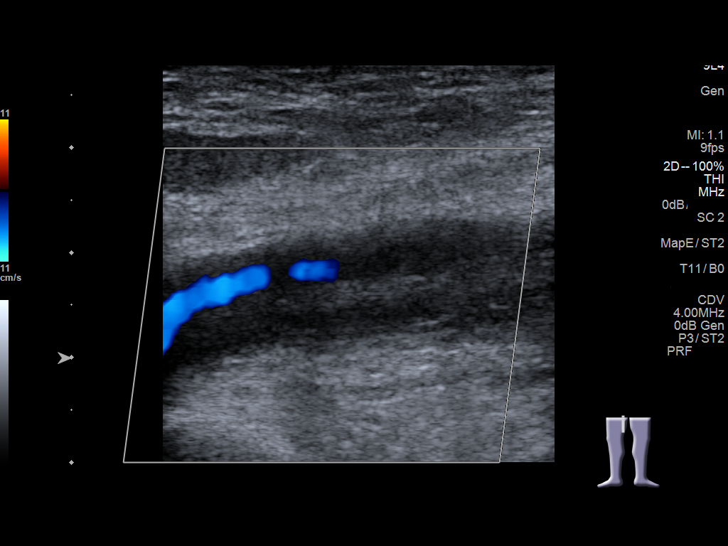
[im 8/30]
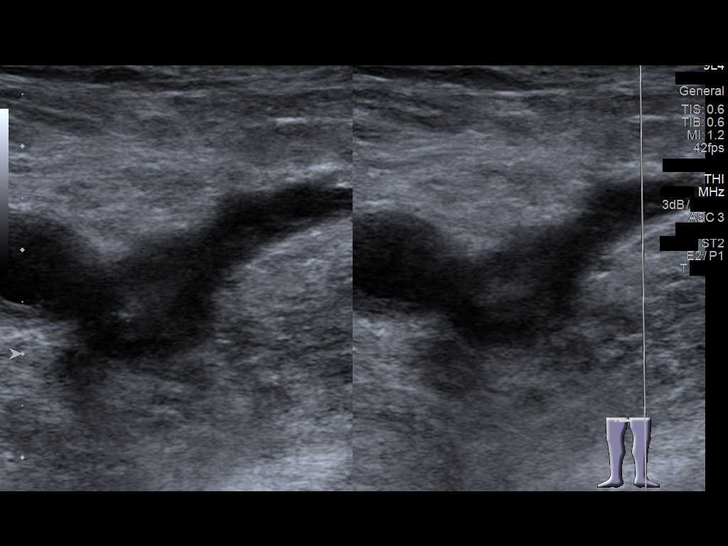
[im 11/30]
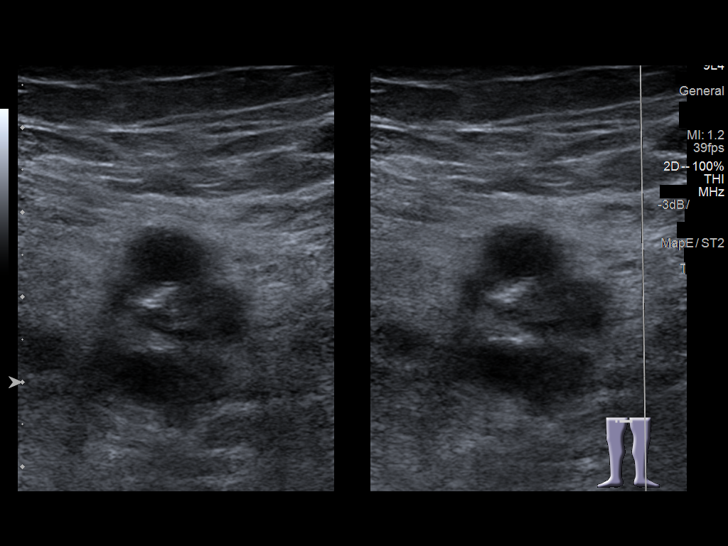
[im 13/30]
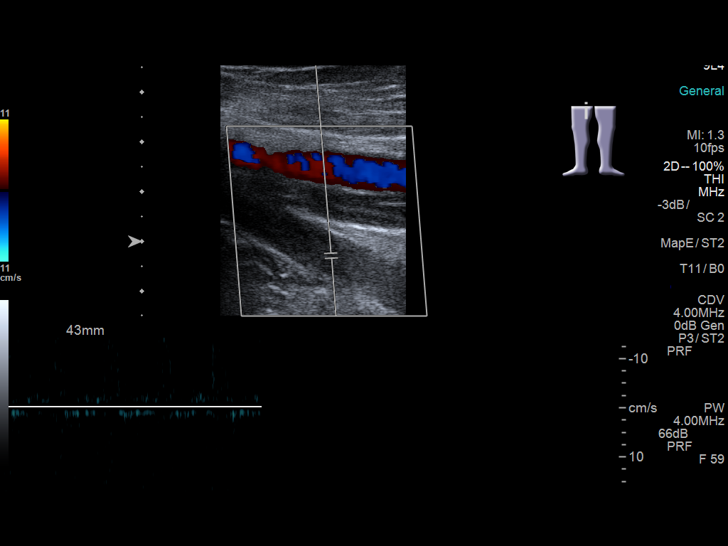
[im 16/30]
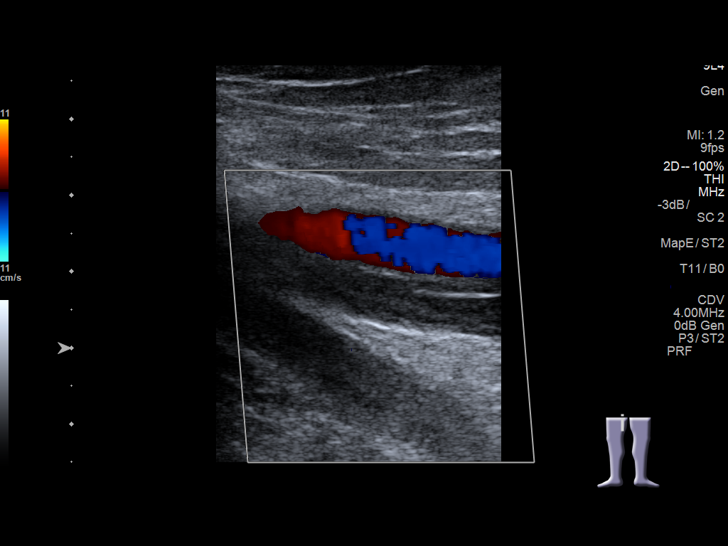
[im 17/30]
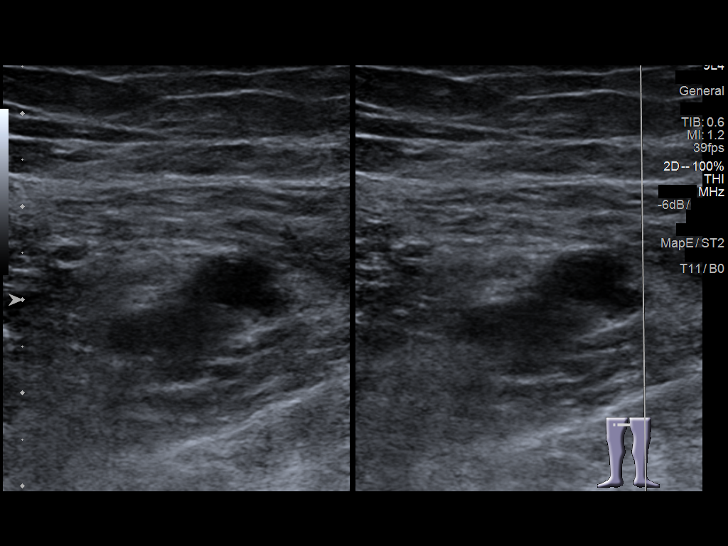
[im 19/30]
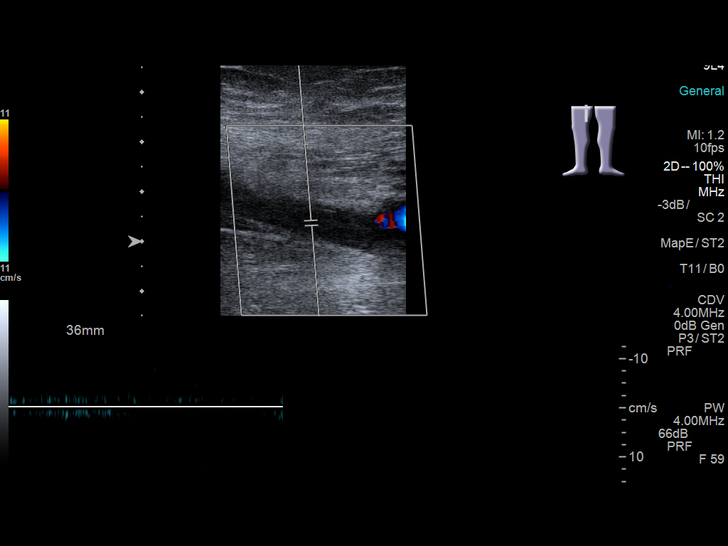
[im 22/30]
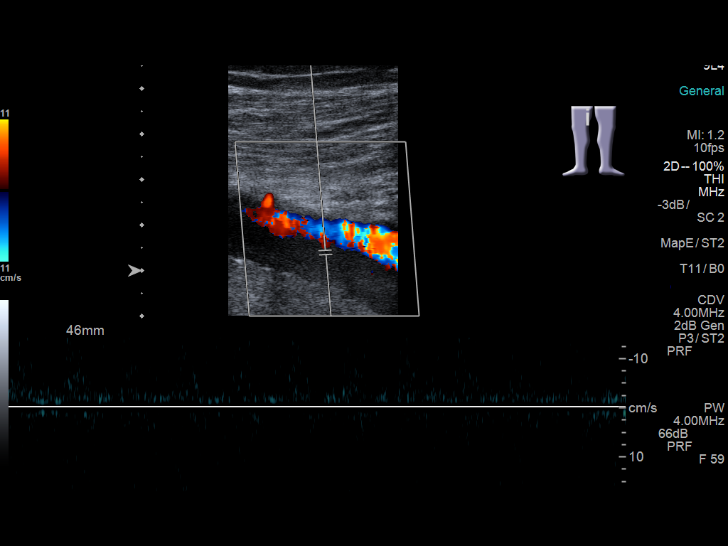
[im 24/30]
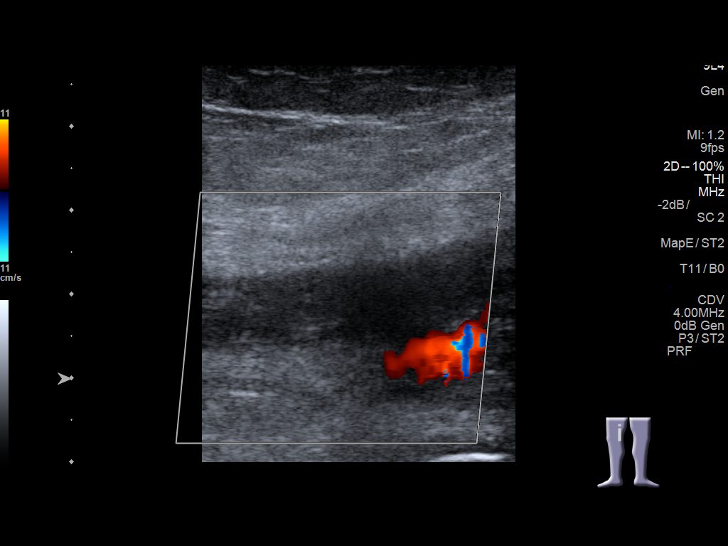
[im 27/30]
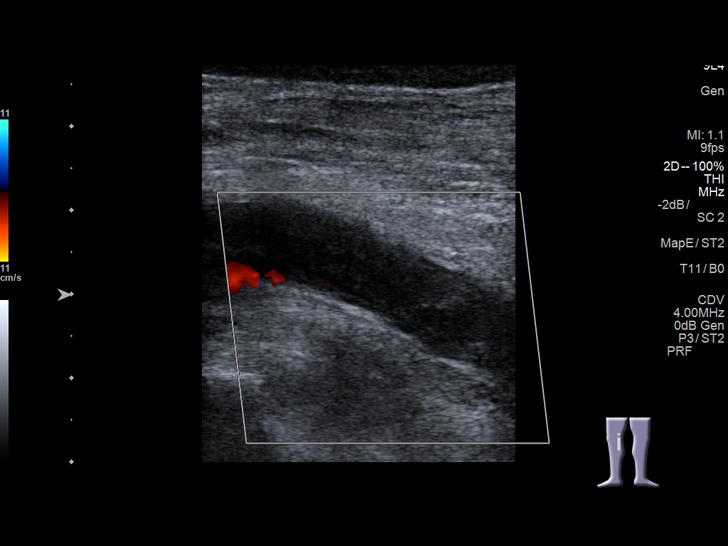
[im 30/30]
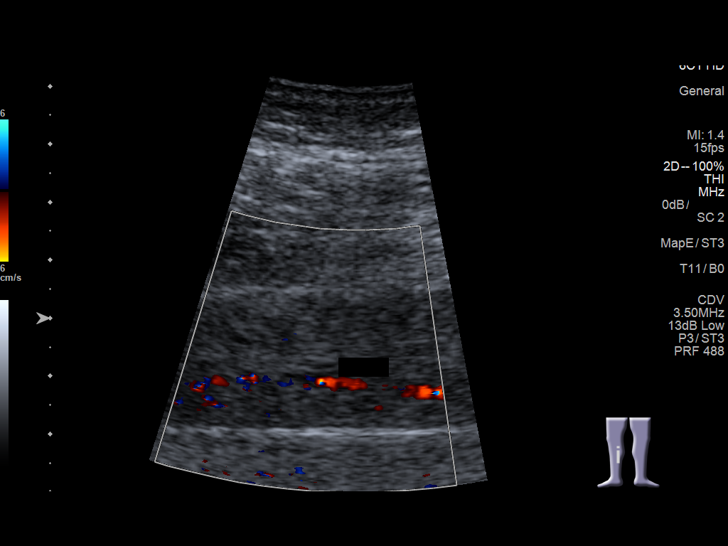

[13 of 24 positions shown; findings below may reference images not displayed]

FINDINGS: Contralateral Common Femoral Vein: Abnormal contralateral left
common femoral vein. The vessel is partially compressible. There is
internal echogenic material but fairly significant flow on color
Doppler imaging.

Common Femoral Vein: Abnormal. The vessel is not compressible. There
is nonocclusive echogenic material within the common femoral vein
consistent with acute thrombus.

Saphenofemoral Junction: Extension of thrombus into the
saphenofemoral junction. The remainder the great saphenous vein is
patent.

Profunda Femoral Vein: Extension of occlusive thrombus into the
profunda femoral vein.

Femoral Vein: Extension of occlusive thrombus into the femoral vein
extending throughout the thigh. No evidence of color flow on Doppler
imaging.

Popliteal Vein: Extension of occlusive thrombus into the popliteal
vein.

Calf Veins: Occlusive thrombus extends into the calf veins in the
proximal calf.

Superficial Great Saphenous Vein: No evidence of thrombus. Normal
compressibility.

Venous Reflux:  None.

Other Findings:  None.
IMPRESSION: 1. Positive for large volume right lower extremity acute DVT which
is nonocclusive in the common femoral vein but occlusive in the
femoral, popliteal and calf veins.
2. Also positive for acute versus chronic nonocclusive thrombus in
the contralateral left common femoral vein.

## 2022-09-21 DEATH — deceased
